# Patient Record
Sex: Female | Born: 1985
Health system: Southern US, Community
[De-identification: ages and names within clinical notes are randomized; demographics above are authoritative.]

## PROBLEM LIST (undated history)

## (undated) DIAGNOSIS — F419 Anxiety disorder, unspecified: Secondary | ICD-10-CM

## (undated) HISTORY — DX: Anxiety disorder, unspecified: F41.9

---

## 2015-09-11 ENCOUNTER — Encounter: Payer: Self-pay | Admitting: Emergency Medicine

## 2015-09-11 ENCOUNTER — Emergency Department
Admission: EM | Admit: 2015-09-11 | Discharge: 2015-09-11 | Disposition: A | Discharge status: Home / Self Care | Payer: BLUE CROSS/BLUE SHIELD | Source: Home / Self Care | Attending: Family Medicine | Admitting: Family Medicine

## 2015-09-11 DIAGNOSIS — H00016 Hordeolum externum left eye, unspecified eyelid: Secondary | ICD-10-CM

## 2015-09-11 MED ORDER — SULFACETAMIDE SODIUM 10 % OP OINT: TOPICAL_OINTMENT | Freq: Four times a day (QID) | OPHTHALMIC | Status: DC

## 2015-09-11 NOTE — Discharge Instructions (Signed)
Begin applying warm compresses to left eye four times daily.  Stye A stye is a bump on your eyelid caused by a bacterial infection. A stye can form inside the eyelid (internal stye) or outside the eyelid (external stye). An internal stye may be caused by an infected oil-producing gland inside your eyelid. An external stye may be caused by an infection at the base of your eyelash (hair follicle). Styes are very common. Anyone can get them at any age. They usually occur in just one eye, but you may have more than one in either eye.  CAUSES  The infection is almost always caused by bacteria called Staphylococcus aureus. This is a common type of bacteria that lives on your skin. RISK FACTORS You may be at higher risk for a stye if you have had one before. You may also be at higher risk if you have:  Diabetes.  Long-term illness.  Long-term eye redness.  A skin condition called seborrhea.  High fat levels in your blood (lipids). SIGNS AND SYMPTOMS  Eyelid pain is the most common symptom of a stye. Internal styes are more painful than external styes. Other signs and symptoms may include:  Painful swelling of your eyelid.  A scratchy feeling in your eye.  Tearing and redness of your eye.  Pus draining from the stye. DIAGNOSIS  Your health care provider may be able to diagnose a stye just by examining your eye. The health care provider may also check to make sure:  You do not have a fever or other signs of a more serious infection.  The infection has not spread to other parts of your eye or areas around your eye. TREATMENT  Most styes will clear up in a few days without treatment. In some cases, you may need to use antibiotic drops or ointment to prevent infection. Your health care provider may have to drain the stye surgically if your stye is:  Large.  Causing a lot of pain.  Interfering with your vision. This can be done using a thin blade or a needle.  HOME CARE INSTRUCTIONS     Take medicines only as directed by your health care provider.  Apply a clean, warm compress to your eye for 10 minutes, 4 times a day.  Do not wear contact lenses or eye makeup until your stye has healed.  Do not try to pop or drain the stye. SEEK MEDICAL CARE IF:  You have chills or a fever.  Your stye does not go away after several days.  Your stye affects your vision.  Your eyeball becomes swollen, red, or painful. MAKE SURE YOU:  Understand these instructions.  Will watch your condition.  Will get help right away if you are not doing well or get worse.   This information is not intended to replace advice given to you by your health care provider. Make sure you discuss any questions you have with your health care provider.   Document Released: 06/01/2005 Document Revised: 09/12/2014 Document Reviewed: 12/06/2013 Elsevier Interactive Patient Education [employer]2016 Elsevier Inc.

## 2015-09-11 NOTE — ED Provider Notes (Signed)
CSN: [id number]     Arrival date & time 09/11/15  [id number] History   First MD Initiated Contact with Patient 09/11/15 [phone]     Chief Complaint  Patient presents with  . Eye Problem      HPI Comments: Patient complains of two day history of swelling and soreness in her left upper eyelid.  No changes in vision.  Patient is a 30 y.o. female presenting with eye problem. The history is provided by the patient.  Eye Problem Location:  L eye Quality:  Aching Severity:  Mild Onset quality:  Gradual Duration:  2 days Timing:  Constant Progression:  Worsening Chronicity:  New Context: contact lenses   Context: not burn, not foreign body and not scratch   Relieved by:  None tried Worsened by:  Contact Ineffective treatments:  Heat Associated symptoms: blurred vision, discharge, itching, redness and swelling   Associated symptoms: no decreased vision, no double vision, no foreign body sensation, no headaches, no photophobia and no tearing   Risk factors: no recent URI     History reviewed. No pertinent past medical history. History reviewed. No pertinent past surgical history. Family History  Problem Relation Age of Onset  . Cancer Mother    Social History  Substance Use Topics  . Smoking status: Former Games developermoker  . Smokeless tobacco: None  . Alcohol Use: Yes   OB History    No data available     Review of Systems  Eyes: Positive for blurred vision, discharge, redness and itching. Negative for double vision and photophobia.  Neurological: Negative for headaches.  All other systems reviewed and are negative.   Allergies  Penicillins  Home Medications   Prior to Admission medications   Medication Sig Start Date End Date Taking? Authorizing Provider  sulfacetamide (BLEPH-10) 10 % ophthalmic ointment Place into the left eye 4 (four) times daily. 09/11/15   Lattie HawStephen A Beese, MD   Meds Ordered and Administered this Visit  Medications - No data to display  BP 121/71 mmHg  Pulse 95   Temp(Src) 98.3 F (36.8 C) (Oral)  Ht 5\' 4"  (1.626 m)  Wt 180 lb (81.647 kg)  BMI 30.88 kg/m2  SpO2 96%  LMP 09/08/2015 (Exact Date) No data found.   Physical Exam  Constitutional: She appears well-developed and well-nourished. No distress.  HENT:  Head: Normocephalic.  Nose: Nose normal.  Mouth/Throat: Oropharynx is clear and moist.  Eyes: Conjunctivae and EOM are normal. Pupils are equal, round, and reactive to light. Lids are everted and swept, no foreign bodies found. Left eye exhibits hordeolum. Left eye exhibits no chemosis, no discharge and no exudate. No foreign body present in the left eye.    Left upper eyelid mildly swollen, erythematous, and tender to palpation.  Not fluctuant.  No evidence pre-septal cellulitis  Neck: Neck supple.  Lymphadenopathy:    She has no cervical adenopathy.  Nursing note and vitals reviewed.   ED Course  Procedures  None   Visual Acuity Review  Right Eye Distance: 20/30 Left Eye Distance: 20/40 Bilateral Distance: 20/20 (with correction)    MDM   1. External hordeolum, left    Begin sulfacetamide ophthalmic ointment QID. Begin applying warm compresses to left eye four times daily. Followup with ophthalmologist if not improved 3 to 4 times daily.    Lattie HawStephen A Beese, MD 09/11/15 [phone]

## 2015-09-11 NOTE — ED Notes (Signed)
Left upper eye lid red, swollen and painful x 2 days

## 2015-09-16 ENCOUNTER — Telehealth: Payer: Self-pay | Admitting: *Deleted

## 2015-09-16 MED ORDER — ERYTHROMYCIN 5 MG/GM OP OINT: TOPICAL_OINTMENT | OPHTHALMIC | Status: DC

## 2015-12-15 ENCOUNTER — Encounter: Payer: Self-pay | Admitting: *Deleted

## 2015-12-15 ENCOUNTER — Emergency Department
Admission: EM | Admit: 2015-12-15 | Discharge: 2015-12-15 | Disposition: A | Discharge status: Home / Self Care | Payer: BLUE CROSS/BLUE SHIELD | Source: Home / Self Care | Attending: Family Medicine | Admitting: Family Medicine

## 2015-12-15 DIAGNOSIS — J029 Acute pharyngitis, unspecified: Secondary | ICD-10-CM | POA: Diagnosis not present

## 2015-12-15 DIAGNOSIS — R05 Cough: Secondary | ICD-10-CM

## 2015-12-15 DIAGNOSIS — R059 Cough, unspecified: Secondary | ICD-10-CM

## 2015-12-15 LAB — POCT RAPID STREP A (OFFICE): Rapid Strep A Screen: NEGATIVE

## 2015-12-15 NOTE — Discharge Instructions (Signed)
You may take 400-600mg Ibuprofen (Motrin) every 6-8 hours for fever and pain  °Alternate with Tylenol  °You may take 500mg Tylenol every 4-6 hours as needed for fever and pain  °Follow-up with your primary care provider next week for recheck of symptoms if not improving.  °Be sure to drink plenty of fluids and rest, at least 8hrs of sleep a night, preferably more while you are sick. °Return urgent care or go to closest ER if you cannot keep down fluids/signs of dehydration, fever not reducing with Tylenol, difficulty breathing/wheezing, stiff neck, worsening condition, or other concerns (see below)  ° ° °Cool Mist Vaporizers °Vaporizers may help relieve the symptoms of a cough and cold. They add moisture to the air, which helps mucus to become thinner and less sticky. This makes it easier to breathe and cough up secretions. Cool mist vaporizers do not cause serious burns like hot mist vaporizers, which may also be called steamers or humidifiers. Vaporizers have not been proven to help with colds. You should not use a vaporizer if you are allergic to mold. °HOME CARE INSTRUCTIONS °· Follow the package instructions for the vaporizer. °· Do not use anything other than distilled water in the vaporizer. °· Do not run the vaporizer all of the time. This can cause mold or bacteria to grow in the vaporizer. °· Clean the vaporizer after each time it is used. °· Clean and dry the vaporizer well before storing it. °· Stop using the vaporizer if worsening respiratory symptoms develop. °  °This information is not intended to replace advice given to you by your health care provider. Make sure you discuss any questions you have with your health care provider. °  °Document Released: 05/19/2004 Document Revised: 08/27/2013 Document Reviewed: 01/09/2013 °Elsevier Interactive Patient Education ©2016 Elsevier Inc. ° °

## 2015-12-15 NOTE — ED Provider Notes (Signed)
CSN: [id number]     Arrival date & time 12/15/15  4098 History   First MD Initiated Contact with Patient 12/15/15 0930     Chief Complaint  Patient presents with  . Sore Throat   (Consider location/radiation/quality/duration/timing/severity/associated sxs/prior Treatment) HPI The pt is a 29yo female presenting to [facility] with c/o sore throat and cough that started about 3 days ago. Cough is mildly intermittent and dry.  Throat pain has been waxing and waning, worse in the morning and with swallowing.  Minimal pain at this time. She notes she was around children this weekend in [place] but no known sick contacts. Denies fever, chills, n/v/d.    History reviewed. No pertinent past medical history. History reviewed. No pertinent past surgical history. Family History  Problem Relation Age of Onset  . Cancer Mother     breast  . Heart disease Father    Social History  Substance Use Topics  . Smoking status: Former Games developer  . Smokeless tobacco: None  . Alcohol Use: Yes   OB History    No data available     Review of Systems  Constitutional: Negative for fever and chills.  HENT: Positive for sore throat. Negative for congestion, ear pain, trouble swallowing and voice change.   Respiratory: Positive for cough. Negative for shortness of breath.   Cardiovascular: Negative for chest pain and palpitations.  Gastrointestinal: Negative for nausea, vomiting, abdominal pain and diarrhea.  Musculoskeletal: Negative for myalgias, back pain and arthralgias.  Skin: Negative for rash.    Allergies  Penicillins  Home Medications   Prior to Admission medications   Not on File   Meds Ordered and Administered this Visit  Medications - No data to display  BP 129/81 mmHg  Pulse 98  Temp(Src) 98.4 F (36.9 C) (Oral)  Resp 16  Ht  (1.626 m)  Wt 178 lb (80.74 kg)  BMI 30.54 kg/m2  SpO2 98%  LMP 12/15/2015 No data found.   Physical Exam  Constitutional: She appears  well-developed and well-nourished. No distress.  HENT:  Head: Normocephalic and atraumatic.  Right Ear: Tympanic membrane normal.  Left Ear: Tympanic membrane normal.  Nose: Nose normal.  Mouth/Throat: Uvula is midline and mucous membranes are normal. Posterior oropharyngeal erythema present. No oropharyngeal exudate, posterior oropharyngeal edema or tonsillar abscesses.  Eyes: Conjunctivae are normal. No scleral icterus.  Neck: Normal range of motion. Neck supple.  Cardiovascular: Normal rate, regular rhythm and normal heart sounds.   Pulmonary/Chest: Effort normal and breath sounds normal. No stridor. No respiratory distress. She has no wheezes. She has no rales.  Abdominal: Soft. She exhibits no distension. There is no tenderness.  Musculoskeletal: Normal range of motion.  Lymphadenopathy:    She has no cervical adenopathy.  Neurological: She is alert.  Skin: Skin is warm and dry. She is not diaphoretic.  Nursing note and vitals reviewed.   ED Course  Procedures (including critical care time)  Labs Review Labs Reviewed  STREP A DNA PROBE  POCT RAPID STREP A (OFFICE)    Imaging Review No results found.   MDM   1. Acute pharyngitis, unspecified etiology   2. Cough    Pt c/o cough and sore throat for 3 days. Pt is afebrile. Moist mucous membranes.   Rapid strep: Negative  No evidence of bacterial infection at this time. Symptoms likely viral. Encouraged symptomatic treatment.  Advised pt to use acetaminophen and ibuprofen as needed for fever and pain. Encouraged rest and fluids including salt  water gargles. F/u with PCP in 7-10 days if not improving, sooner if worsening. Pt verbalized understanding and agreement with tx plan.     Junius FinnerErin O'Malley, PA-C 12/15/15 [phone]

## 2015-12-15 NOTE — ED Notes (Signed)
Pt c/o sore throat and non productive cough x 3 days. Denies fever. Taking IBF, Nyquil and numbing cough gtts.

## 2015-12-16 ENCOUNTER — Telehealth: Payer: Self-pay | Admitting: *Deleted

## 2015-12-16 LAB — STREP A DNA PROBE: GASP: NOT DETECTED

## 2015-12-18 ENCOUNTER — Encounter: Payer: Self-pay | Admitting: Emergency Medicine

## 2015-12-18 ENCOUNTER — Emergency Department (INDEPENDENT_AMBULATORY_CARE_PROVIDER_SITE_OTHER)
Admission: EM | Admit: 2015-12-18 | Discharge: 2015-12-18 | Disposition: A | Discharge status: Home / Self Care | Payer: BLUE CROSS/BLUE SHIELD | Source: Home / Self Care | Attending: Family Medicine | Admitting: Family Medicine

## 2015-12-18 DIAGNOSIS — J069 Acute upper respiratory infection, unspecified: Secondary | ICD-10-CM

## 2015-12-18 DIAGNOSIS — B9789 Other viral agents as the cause of diseases classified elsewhere: Principal | ICD-10-CM

## 2015-12-18 MED ORDER — AZITHROMYCIN 250 MG PO TABS: ORAL_TABLET | ORAL | Status: DC

## 2015-12-18 MED ORDER — BENZONATATE 200 MG PO CAPS: 200.0000 mg | ORAL_CAPSULE | Freq: Every day | ORAL | Status: DC

## 2015-12-18 NOTE — ED Notes (Signed)
Cough x 4 days was seen by Denny PeonErin on Tuesday, but cough is worse. Throat culture was neg.

## 2015-12-18 NOTE — Discharge Instructions (Signed)
Take plain guaifenesin (1200mg  extended release tabs such as Mucinex) twice daily, with plenty of water, for cough and congestion.  May add Pseudoephedrine (30mg , one or two every 4 to 6 hours) for sinus congestion.  Get adequate rest.   May use Afrin nasal spray (or generic oxymetazoline) twice daily for about 5 days and then discontinue.  Also recommend using saline nasal spray several times daily and saline nasal irrigation (AYR is a common brand).    Try warm salt water gargles for sore throat.  Stop all antihistamines for now, and other non-prescription cough/cold preparations. May take Ibuprofen 200mg , 4 tabs every 8 hours with food for sore throat, fever, etc.

## 2015-12-18 NOTE — ED Provider Notes (Signed)
CSN: [id number]     Arrival date & time 12/18/15  0825 History   First MD Initiated Contact with Patient 12/18/15 [phone]     Chief Complaint  Patient presents with  . Cough      HPI Comments: About 6 days ago patient developed typical cold-like symptoms developing over several days,  including mild sore throat, sinus congestion and fatigue.  She was seen and evaluated her 3 days ago for a viral URI.  Since then she has developed increasing cough that is now non-productive and worse at night.  She complains of tightness in her anterior chest and shortness of breath with activity.  She still has mild sore throat.  She states that her right ear feels clogged.   She states that she is beginning to cough until she gags.  She does not remember her last Tdap.  The history is provided by the patient.    History reviewed. No pertinent past medical history. History reviewed. No pertinent past surgical history. Family History  Problem Relation Age of Onset  . Cancer Mother     breast  . Heart disease Father    Social History  Substance Use Topics  . Smoking status: Former Games developer  . Smokeless tobacco: None  . Alcohol Use: Yes   OB History    No data available     Review of Systems + sore throat + cough No pleuritic pain but feels tightness in anterior chest ? wheezing ? nasal congestion + post-nasal drainage No sinus pain/pressure No itchy/red eyes ? right earache No hemoptysis + SOB No fever, + chills No nausea No vomiting No abdominal pain No diarrhea No urinary symptoms No skin rash + fatigue No myalgias No headache Used OTC meds without relief  Allergies  Penicillins  Home Medications   Prior to Admission medications   Medication Sig Start Date End Date Taking? Authorizing Provider  azithromycin (ZITHROMAX Z-PAK) 250 MG tablet Take 2 tabs today; then begin one tab once daily for 4 more days. 12/18/15   Lattie Haw, MD  benzonatate (TESSALON) 200 MG capsule Take 1  capsule (200 mg total) by mouth at bedtime. Take as needed for cough 12/18/15   Lattie Haw, MD   Meds Ordered and Administered this Visit  Medications - No data to display  BP 124/84 mmHg  Pulse 102  Temp(Src) 99.1 F (37.3 C)  Ht  (1.626 m)  Wt 175 lb (79.379 kg)  BMI 30.02 kg/m2  SpO2 98%  LMP 12/15/2015 No data found.   Physical Exam Nursing notes and Vital Signs reviewed. Appearance:  Patient appears stated age, and in no acute distress Eyes:  Pupils are equal, round, and reactive to light and accomodation.  Extraocular movement is intact.  Conjunctivae are not inflamed  Ears:  Canals normal.  Tympanic membranes normal, although right tympanic membrane appears slightly bulging  Nose:  Markedly congested turbinates.  No sinus tenderness.     Pharynx:  Uvula edematous, otherwise normal Neck:  Supple.  Tender enlarged posterior/lateral nodes are palpated bilaterally  Lungs:  Clear to auscultation.  Breath sounds are equal.  Moving air well. Heart:  Regular rate and rhythm without murmurs, rubs, or gallops.  Abdomen:  Nontender without masses or hepatosplenomegaly.  Bowel sounds are present.  No CVA or flank tenderness.  Extremities:  No edema.  Skin:  No rash present.   ED Course  Procedures  None  Tympanogram:  Normal both ears.   MDM  1. Viral URI with cough     Note low grade fever; ?early bacterial sinusitis Begin Z-pack for atypical coverage.  Prescription written for Benzonatate [facility](Tessalon) to take at bedtime for night-time cough.   Take plain guaifenesin (1200mg  extended release tabs such as Mucinex) twice daily, with plenty of water, for cough and congestion.  May add Pseudoephedrine (30mg , one or two every 4 to 6 hours) for sinus congestion.  Get adequate rest.   May use Afrin nasal spray (or generic oxymetazoline) twice daily for about 5 days and then discontinue.  Also recommend using saline nasal spray several times daily and saline nasal irrigation  (AYR is a common brand).    Try warm salt water gargles for sore throat.  Stop all antihistamines for now, and other non-prescription cough/cold preparations. May take Ibuprofen 200mg , 4 tabs every 8 hours with food for sore throat, fever, etc.   Lattie HawStephen A Chonda Baney, MD 12/18/15 [phone]

## 2016-01-05 ENCOUNTER — Encounter: Payer: Self-pay | Admitting: *Deleted

## 2016-01-05 ENCOUNTER — Emergency Department
Admission: EM | Admit: 2016-01-05 | Discharge: 2016-01-05 | Disposition: A | Discharge status: Home / Self Care | Payer: BLUE CROSS/BLUE SHIELD | Source: Home / Self Care | Attending: Family Medicine | Admitting: Family Medicine

## 2016-01-05 DIAGNOSIS — L250 Unspecified contact dermatitis due to cosmetics: Secondary | ICD-10-CM

## 2016-01-05 MED ORDER — CLINDAMYCIN HCL 300 MG PO CAPS: 300.0000 mg | ORAL_CAPSULE | Freq: Three times a day (TID) | ORAL | Status: DC

## 2016-01-05 MED ORDER — METHYLPREDNISOLONE SODIUM SUCC 125 MG IJ SOLR
80.0000 mg | Freq: Once | INTRAMUSCULAR | Status: AC
  Administered 2016-01-05: 80 mg via INTRAMUSCULAR

## 2016-01-05 MED ORDER — PREDNISONE 20 MG PO TABS: 20.0000 mg | ORAL_TABLET | Freq: Two times a day (BID) | ORAL | Status: DC

## 2016-01-05 NOTE — ED Provider Notes (Signed)
CSN: [id number]     Arrival date & time 01/05/16  1308 History   First MD Initiated Contact with Patient 01/05/16 1339     Chief Complaint  Patient presents with  . Allergic Reaction      HPI Comments: Patient used a new eye mascara two days ago.  Yesterday she awoke with swollen pruritic eyelids.  Today she had increased peri-orbital swelling extending to her cheeks.  She has not felt quite well today.  No fevers, chills, and sweats.  No eye pain.  No changes in vision.  Patient is a 30 y.o. female presenting with eye problem. The history is provided by the patient.  Eye Problem Location:  Both Quality: itching. Severity:  Severe Onset quality:  Gradual Duration:  2 days Timing:  Constant Progression:  Worsening Chronicity:  New Context comment:  New eye mascara Relieved by:  Nothing Worsened by:  Nothing tried Ineffective treatments: cool compresses and Benadryl. Associated symptoms: crusting, discharge, facial rash, inflammation, itching, redness, swelling and tearing   Associated symptoms: no blurred vision, no decreased vision, no double vision, no foreign body sensation, no headaches, no nausea, no photophobia and no scotomas     History reviewed. No pertinent past medical history. History reviewed. No pertinent past surgical history. Family History  Problem Relation Age of Onset  . Cancer Mother     breast  . Heart disease Father    Social History  Substance Use Topics  . Smoking status: Former Games developermoker  . Smokeless tobacco: None  . Alcohol Use: Yes   OB History    No data available     Review of Systems  Eyes: Positive for discharge, redness and itching. Negative for blurred vision, double vision and photophobia.  Gastrointestinal: Negative for nausea.  Neurological: Negative for headaches.  All other systems reviewed and are negative.   Allergies  Penicillins  Home Medications   Prior to Admission medications   Medication Sig Start Date End Date Taking?  Authorizing Provider  clindamycin (CLEOCIN) 300 MG capsule Take 1 capsule (300 mg total) by mouth 3 (three) times daily. 01/05/16   Lattie HawStephen A Jasman Murri, MD  predniSONE (DELTASONE) 20 MG tablet Take 1 tablet (20 mg total) by mouth 2 (two) times daily. Take with food. 01/05/16   Lattie HawStephen A Joron Velis, MD   Meds Ordered and Administered this Visit   Medications  methylPREDNISolone sodium succinate (SOLU-MEDROL) 125 mg/2 mL injection 80 mg (not administered)    BP 135/75 mmHg  Pulse 94  Temp(Src) 98.3 F (36.8 C) (Oral)  Resp 16  Ht 5\' 4"  (1.626 m)  Wt 178 lb (80.74 kg)  BMI 30.54 kg/m2  SpO2 100%  LMP 12/15/2015 No data found.   Physical Exam  Constitutional: She appears well-developed and well-nourished. No distress.  HENT:  Head:    Right Ear: External ear normal.  Left Ear: External ear normal.  Nose: Nose normal.  Mouth/Throat: Oropharynx is clear and moist.  Upper and lower eyelids are edematous and mildly erythematous bilaterally, extending to both cheeks  as noted on diagram.  Upper eyelids are mildly tender to palpation     Eyes: Conjunctivae and EOM are normal. Pupils are equal, round, and reactive to light.    Neck: Neck supple.  Lymphadenopathy:    She has no cervical adenopathy.  Nursing note and vitals reviewed.   ED Course  Procedures none   MDM   1. Contact dermatitis due to cosmetics    Solumedrol 80mg  IM; begin prednisone  burst tomorrow. Concern also for preseptal cellulitis; will begin clindamycin  TID. Begin prednisone on Wednesday 01/06/16. Continue to apply cool compress several times daily.  May take Benadryl or a non-sedating antihistamine. Avoid using contact lens until condition resolved. If symptoms become significantly worse during the night or over the weekend, proceed to the local emergency room.     Lattie Haw, MD 01/05/16 [phone]

## 2016-01-05 NOTE — Discharge Instructions (Signed)
Begin prednisone on Wednesday 01/06/16. Continue to apply cool compress several times daily.  May take Benadryl or a non-sedating antihistamine. Avoid using contact lens until condition resolved. If symptoms become significantly worse during the night or over the weekend, proceed to the local emergency room.    Contact Dermatitis Dermatitis is redness, soreness, and swelling (inflammation) of the skin. Contact dermatitis is a reaction to certain substances that touch the skin. There are two types of contact dermatitis:   Irritant contact dermatitis. This type is caused by something that irritates your skin, such as dry hands from washing them too much. This type does not require previous exposure to the substance for a reaction to occur. This type is more common.  Allergic contact dermatitis. This type is caused by a substance that you are allergic to, such as a nickel allergy or poison ivy. This type only occurs if you have been exposed to the substance (allergen) before. Upon a repeat exposure, your body reacts to the substance. This type is less common. CAUSES  Many different substances can cause contact dermatitis. Irritant contact dermatitis is most commonly caused by exposure to:   Makeup.   Soaps.   Detergents.   Bleaches.   Acids.   Metal salts, such as nickel.  Allergic contact dermatitis is most commonly caused by exposure to:   Poisonous plants.   Chemicals.   Jewelry.   Latex.   Medicines.   Preservatives in products, such as clothing.  RISK FACTORS This condition is more likely to develop in:   People who have jobs that expose them to irritants or allergens.  People who have certain medical conditions, such as asthma or eczema.  SYMPTOMS  Symptoms of this condition may occur anywhere on your body where the irritant has touched you or is touched by you. Symptoms include:  Dryness or flaking.   Redness.   Cracks.   Itching.   Pain or a  burning feeling.   Blisters.  Drainage of small amounts of blood or clear fluid from skin cracks. With allergic contact dermatitis, there may also be swelling in areas such as the eyelids, mouth, or genitals.  DIAGNOSIS  This condition is diagnosed with a medical history and physical exam. A patch skin test may be performed to help determine the cause. If the condition is related to your job, you may need to see an occupational medicine specialist. TREATMENT Treatment for this condition includes figuring out what caused the reaction and protecting your skin from further contact. Treatment may also include:   Steroid creams or ointments. Oral steroid medicines may be needed in more severe cases.  Antibiotics or antibacterial ointments, if a skin infection is present.  Antihistamine lotion or an antihistamine taken by mouth to ease itching.  A bandage (dressing). HOME CARE INSTRUCTIONS Skin Care  Moisturize your skin as needed.   Apply cool compresses to the affected areas.  Do not scratch your skin.  Bathe less frequently, such as every other day.  Bathe in lukewarm water. Avoid using hot water. Medicines  Take or apply over-the-counter and prescription medicines only as told by your health care provider.   If you were prescribed an antibiotic medicine, take or apply your antibiotic as told by your health care provider. Do not stop using the antibiotic even if your condition starts to improve. General Instructions  Keep all follow-up visits as told by your health care provider. This is important.  Avoid the substance that caused your reaction. If  you do not know what caused it, keep a journal to try to track what caused it. Write down:  What you eat.  What cosmetic products you use.  What you drink.  What you wear in the affected area. This includes jewelry.  If you were given a dressing, take care of it as told by your health care provider. This includes when to  change and remove it. SEEK MEDICAL CARE IF:   Your condition does not improve with treatment.  Your condition gets worse.  You have signs of infection such as swelling, tenderness, redness, soreness, or warmth in the affected area.  You have a fever.  You have new symptoms. SEEK IMMEDIATE MEDICAL CARE IF:   You have a severe headache, neck pain, or neck stiffness.  You vomit.  You feel very sleepy.  You notice red streaks coming from the affected area.  Your bone or joint underneath the affected area becomes painful after the skin has healed.  The affected area turns darker.  You have difficulty breathing.   This information is not intended to replace advice given to you by your health care provider. Make sure you discuss any questions you have with your health care provider.   Document Released: 08/19/2000 Document Revised: 05/13/2015 Document Reviewed: 01/07/2015 Elsevier Interactive Patient Education [employer]

## 2016-01-05 NOTE — ED Notes (Signed)
Pt c/o bilateral eye swelling x 2 days after using a new mascara. She has applied ice and took Benadryl 25mg  at 12:30pm today.

## 2016-11-23 ENCOUNTER — Emergency Department
Admission: EM | Admit: 2016-11-23 | Discharge: 2016-11-23 | Disposition: A | Discharge status: Home / Self Care | Payer: BLUE CROSS/BLUE SHIELD | Source: Home / Self Care | Attending: Family Medicine | Admitting: Family Medicine

## 2016-11-23 ENCOUNTER — Emergency Department (INDEPENDENT_AMBULATORY_CARE_PROVIDER_SITE_OTHER): Payer: BLUE CROSS/BLUE SHIELD

## 2016-11-23 DIAGNOSIS — M19071 Primary osteoarthritis, right ankle and foot: Secondary | ICD-10-CM | POA: Diagnosis not present

## 2016-11-23 DIAGNOSIS — M7731 Calcaneal spur, right foot: Secondary | ICD-10-CM

## 2016-11-23 DIAGNOSIS — M79672 Pain in left foot: Secondary | ICD-10-CM

## 2016-11-23 DIAGNOSIS — M722 Plantar fascial fibromatosis: Secondary | ICD-10-CM

## 2016-11-23 DIAGNOSIS — M79671 Pain in right foot: Secondary | ICD-10-CM

## 2016-11-23 MED ORDER — NAPROXEN 500 MG PO TABS: 500.0000 mg | ORAL_TABLET | Freq: Two times a day (BID) | ORAL | 0 refills | Status: DC

## 2016-11-23 NOTE — ED Provider Notes (Signed)
CSN: [id number]     Arrival date & time 11/23/16  0813 History   First MD Initiated Contact with Patient 11/23/16 [phone]     Chief Complaint  Patient presents with  . Foot Pain   (Consider location/radiation/quality/duration/timing/severity/associated sxs/prior Treatment) HPI Betty Stanton is a 31 y.o. female presenting to UC with c/o 1 month of gradually worsening Right foot pain that is worse in her heel.  Over the last 1 week she started to develop pain in her Left foot toward the ball of her foot.  Yesterday, after work, pain started to radiate into her legs.  Pain is 2/10 at this time but moderately worse after going to work all day. Pt notes she is walking on hard floors during the day.  She recently got new shoes with better cushioning and has been soaking her feet in Epson salt but no relief.  She has not tried any acetaminophen or ibuprofen.    History reviewed. No pertinent past medical history. History reviewed. No pertinent surgical history. Family History  Problem Relation Age of Onset  . Cancer Mother     breast  . Heart disease Father    Social History  Substance Use Topics  . Smoking status: Former Games developermoker  . Smokeless tobacco: Not on file  . Alcohol use Yes   OB History    No data available     Review of Systems  Musculoskeletal: Positive for arthralgias, gait problem and myalgias.  Skin: Negative for color change and wound.  Neurological: Negative for weakness and numbness.    Allergies  Penicillins  Home Medications   Prior to Admission medications   Medication Sig Start Date End Date Taking? Authorizing Provider  clindamycin (CLEOCIN) 300 MG capsule Take 1 capsule (300 mg total) by mouth 3 (three) times daily. 01/05/16   Lattie HawStephen A Beese, MD  naproxen (NAPROSYN) 500 MG tablet Take 1 tablet (500 mg total) by mouth 2 (two) times daily. 11/23/16   Junius FinnerErin O'Malley, PA-C  predniSONE (DELTASONE) 20 MG tablet Take 1 tablet (20 mg total) by mouth 2 (two) times daily.  Take with food. 01/05/16   Lattie HawStephen A Beese, MD   Meds Ordered and Administered this Visit  Medications - No data to display  BP 130/84 (BP Location: Left Arm)   Pulse 73   Temp 97.9 F (36.6 C) (Oral)   Ht 5\' 4"  (1.626 m)   Wt 173 lb (78.5 kg)   LMP 11/04/2016   SpO2 99%   BMI 29.70 kg/m  No data found.   Physical Exam  Constitutional: She is oriented to person, place, and time. She appears well-developed and well-nourished. No distress.  HENT:  Head: Normocephalic and atraumatic.  Eyes: EOM are normal.  Neck: Normal range of motion.  Cardiovascular: Normal rate.   Pulses:      Dorsalis pedis pulses are 2+ on the right side, and 2+ on the left side.  Pulmonary/Chest: Effort normal.  Musculoskeletal: Normal range of motion. She exhibits tenderness. She exhibits no edema.  Bilateral feet: no edema or deformity. Mild tenderness to bottom of Right heel. Mild tenderness to distal plantar aspect of metatarsals in Left foot.  Full ROM ankles and toes.   Neurological: She is alert and oriented to person, place, and time.  Skin: Skin is warm and dry. Capillary refill takes less than 2 seconds. She is not diaphoretic.  Psychiatric: She has a normal mood and affect. Her behavior is normal.  Nursing note and vitals reviewed.  Urgent Care Course     Procedures (including critical care time)  Labs Review Labs Reviewed - No data to display  Imaging Review Dg Foot Complete Right  Result Date: 11/23/2016 CLINICAL DATA:  Right heel pain for 1 month, no known injury, initial encounter EXAM: RIGHT FOOT COMPLETE - 3+ VIEW COMPARISON:  None. FINDINGS: No acute fracture or dislocation is noted. Small plantar spur is noted arising from the calcaneus. No soft tissue abnormality is noted. IMPRESSION: Mild degenerative change without acute abnormality. Electronically Signed   By: Alcide Clever M.D.   On: 11/23/2016 08:55    MDM   1. Bilateral foot pain   2. Pain of right heel   3. Heel  spur, right   4. Plantar fasciitis of right foot    Hx and exam c/w plantar fascitis   Rx: Naproxen Encouraged to wear heal cups in her shoes to help with the pain. Home care instructions with exercises provided. f/u with Sports Medicine in 1-2 weeks if not improving.     Junius Finner, PA-C 11/23/16 [phone]

## 2016-11-23 NOTE — ED Triage Notes (Signed)
Pt stated that she has had pain in the feet for about a month, has become much worse the last week or so.  It started mostly on the right foot, with pulling across the arch into the heel.  Now it is in both feet.  Yesterday pain started radiating into her legs.

## 2017-01-27 ENCOUNTER — Ambulatory Visit: Payer: BLUE CROSS/BLUE SHIELD | Admitting: Podiatry

## 2017-02-09 ENCOUNTER — Ambulatory Visit (INDEPENDENT_AMBULATORY_CARE_PROVIDER_SITE_OTHER): Payer: BLUE CROSS/BLUE SHIELD | Admitting: Podiatry

## 2017-02-09 ENCOUNTER — Encounter: Payer: Self-pay | Admitting: Podiatry

## 2017-02-09 ENCOUNTER — Ambulatory Visit: Payer: BLUE CROSS/BLUE SHIELD

## 2017-02-09 DIAGNOSIS — M722 Plantar fascial fibromatosis: Secondary | ICD-10-CM

## 2017-02-09 MED ORDER — MELOXICAM 15 MG PO TABS: 15.0000 mg | ORAL_TABLET | Freq: Every day | ORAL | 2 refills | Status: AC

## 2017-02-09 NOTE — Patient Instructions (Signed)

## 2017-02-09 NOTE — Progress Notes (Signed)
   Subjective:    Patient ID: Betty CoolerSara Stanton, female    DOB: 01-Sep-1986, 31 y.o.   MRN: [id number]  HPI  31 year old female presents the office today for concerns of right heel pain which is been ongoing for about 1 month. She states that she went to urgent care and was told she has a heel spur. She gets pain after periods of rest which is improved with activity but she also does work at [employer] and stands on concrete floor she gets pain. She denies any recent injury or trauma. No swelling or redness. No numbness or tingling. The pain does not wake her up at night. She's had no recent treatment. She did go to urgent care the prescribed medication which she did not get this filled as she felt that she did not need this medication. She has no other concerns.   Review of Systems  All other systems reviewed and are negative.      Objective:   Physical Exam General: AAO x3, NAD  Dermatological: Skin is warm, dry and supple bilateral. Nails x 10 are well manicured; remaining integument appears unremarkable at this time. There are no open sores, no preulcerative lesions, no rash or signs of infection present.  Vascular: Dorsalis Pedis artery and Posterior Tibial artery pedal pulses are 2/4 bilateral with immedate capillary fill time. Pedal hair growth present. No varicosities and no lower extremity edema present bilateral. There is no pain with calf compression, swelling, warmth, erythema.   Neruologic: Grossly intact via light touch bilateral. Vibratory intact via tuning fork bilateral. Protective threshold with Semmes Wienstein monofilament intact to all pedal sites bilateral. Negative tinel sign.   Musculoskeletal: Tenderness to palpation along the plantar medial tubercle of the calcaneus at the insertion of plantar fascia on the right foot. There is no pain along the course of the plantar fascia within the arch of the foot. Plantar fascia appears to be intact. There is no pain with  lateral compression of the calcaneus or pain with vibratory sensation. There is no pain along the course or insertion of the achilles tendon. No other areas of tenderness to bilateral lower extremities.  Muscular strength 5/5 in all groups tested bilateral.  Gait: Unassisted, Nonantalgic.      Assessment & Plan:  31 year old female right heel pain likely plantar fasciitis with small inferior calcaneal spurring present. -Treatment options discussed including all alternatives, risks, and complications -Etiology of symptoms were discussed -Previous x-rays are reviewed with the patient. -Declined steroid injection. -Prescribed mobic. Discussed side effects of the medication and directed to stop if any are to occur and call the office.  -Plantar fascial brace -Discussed shoe gear modifications and orthotics -Stretching, icing daily. -RTC 3-4 weeks or sooner if needed. Call any questions or concerns.  Ovid CurdMatthew Wagoner, DPM

## 2017-02-12 DIAGNOSIS — M722 Plantar fascial fibromatosis: Secondary | ICD-10-CM | POA: Insufficient documentation

## 2017-03-02 ENCOUNTER — Encounter: Payer: Self-pay | Admitting: Podiatry

## 2017-03-02 ENCOUNTER — Ambulatory Visit (INDEPENDENT_AMBULATORY_CARE_PROVIDER_SITE_OTHER): Payer: BLUE CROSS/BLUE SHIELD | Admitting: Podiatry

## 2017-03-02 DIAGNOSIS — M722 Plantar fascial fibromatosis: Secondary | ICD-10-CM | POA: Diagnosis not present

## 2017-03-02 NOTE — Progress Notes (Signed)
Subjective: 31 year old female presents the office today for follow up evaluation of right heel pain component fasciitis. She states that her pain has much improved her pain skills 3/10. She has been stretching icing intermittently. She was wearing heel cups in her shoes. She has not changed her shoes. She has no other concerns today. No numbness or tingling. Denies any systemic complaints such as fevers, chills, nausea, vomiting. No acute changes since last appointment, and no other complaints at this time.   Objective: AAO x3, NAD DP/PT pulses palpable bilaterally, CRT less than 3 seconds There is minimal tenderness to palpation along the plantar medial tubercle of the calcaneus at the insertion of plantar fascia on the right foot. There is no pain along the course of the plantar fascia within the arch of the foot. Plantar fascia appears to be intact. There is no pain with lateral compression of the calcaneus or pain with vibratory sensation. There is no pain along the course or insertion of the achilles tendon. No other areas of tenderness to bilateral lower extremities. Equinus is present.  No open lesions or pre-ulcerative lesions.  No pain with calf compression, swelling, warmth, erythema  Assessment: Right heel pain, plantar fasciitis which has improved.   Plan: -All treatment options discussed with the patient including all alternatives, risks, complications.  -Continue stretching, icing exercises daily. -Night splint was dispensed. -Again declined steroid injection. -Mobic as needed. -Discussed shoe gear modifications and orthotics. -RTC 4-6 weeks or sooner if needed. -Patient encouraged to call the office with any questions, concerns, change in symptoms.   Ovid CurdMatthew Sheralyn Pinegar, DPM

## 2017-04-06 ENCOUNTER — Encounter: Payer: Self-pay | Admitting: Osteopathic Medicine

## 2017-04-06 ENCOUNTER — Ambulatory Visit (INDEPENDENT_AMBULATORY_CARE_PROVIDER_SITE_OTHER): Payer: BLUE CROSS/BLUE SHIELD | Admitting: Osteopathic Medicine

## 2017-04-06 VITALS — BP 125/83 | HR 80 | Ht 64.0 in | Wt 180.0 lb

## 2017-04-06 DIAGNOSIS — Z0000 Encounter for general adult medical examination without abnormal findings: Secondary | ICD-10-CM | POA: Diagnosis not present

## 2017-04-06 NOTE — Progress Notes (Signed)
HPI: Betty Stanton is a 31 y.o. female  who [facility] today, 04/06/17,  for chief complaint of:  Chief Complaint  Patient presents with  . Establish Care   Patient here for annual physical / wellness exam.  See preventive care reviewed as below.  Recent labs reviewed in detail with the patient.   Additional concerns today include:  Mom diagnosed [age] with breast cancer, This is about 10 years ago, underwent double mastectomy patient states more as a precautionary measure, unknown pathology.    Past medical, surgical, social and family history reviewed: Patient Active Problem List   Diagnosis Date Noted  . Plantar fasciitis 02/12/2017   No past surgical history on file.   Social History  Substance Use Topics  . Smoking status: Former Games developermoker  . Smokeless tobacco: Never Used  . Alcohol use Yes   Family History  Problem Relation Age of Onset  . Cancer Mother        breast  . Heart disease Father      Current medication list and allergy/intolerance information reviewed:   Current Outpatient Prescriptions  Medication Sig Dispense Refill  . meloxicam (MOBIC) 15 MG tablet Take 1 tablet (15 mg total) by mouth daily. 30 tablet 2   No current facility-administered medications for this visit.    Allergies  Allergen Reactions  . Penicillins       Review of Systems:  Constitutional:  No  fever, no chills, No recent illness, No unintentional weight changes. No significant fatigue.   HEENT: No  headache, no vision change, no hearing change, No sore throat, No  sinus pressure  Cardiac: No  chest pain, No  pressure, No palpitations, No  Orthopnea  Respiratory:  No  shortness of breath. No  Cough  Gastrointestinal: No  abdominal pain, No  nausea, No  vomiting,  No  blood in stool, No  diarrhea, No  constipation   Musculoskeletal: No new myalgia/arthralgia  Genitourinary: No  incontinence, No  abnormal genital bleeding, No  abnormal genital discharge  Skin: No  Rash, No other wounds/concerning lesions  Hem/Onc: No  easy bruising/bleeding, No  abnormal lymph node  Endocrine: No cold intolerance,  No heat intolerance. No polyuria/polydipsia/polyphagia   Neurologic: No  weakness, No  dizziness, No  slurred speech/focal weakness/facial droop  Psychiatric: +concerns with depression, +concerns with anxiety, No sleep problems, No mood problems  Exam:  BP 125/83   Pulse 80   Ht 5\' 4"  (1.626 m)   Wt 180 lb (81.6 kg)   LMP 03/05/2017   BMI 30.90 kg/m   Constitutional: VS see above. General Appearance: alert, well-developed, well-nourished, NAD  Eyes: Normal lids and conjunctive, non-icteric sclera  Ears, Nose, Mouth, Throat: MMM, Normal external inspection ears/nares/mouth/lips/gums. TM normal bilaterally. Pharynx/tonsils no erythema, no exudate. Nasal mucosa normal.   Neck: No masses, trachea midline. No thyroid enlargement. No tenderness/mass appreciated. No lymphadenopathy  Respiratory: Normal respiratory effort. no wheeze, no rhonchi, no rales  Cardiovascular: S1/S2 normal, no murmur, no rub/gallop auscultated. RRR. No lower extremity edema.   Gastrointestinal: Nontender, no masses. No hepatomegaly, no splenomegaly. No hernia appreciated. Bowel sounds normal. Rectal exam deferred.   Musculoskeletal: Gait normal. No clubbing/cyanosis of digits.   Neurological: Normal balance/coordination. No tremor  Skin: warm, dry, intact. No rash/ulcer.     Psychiatric: Normal judgment/insight. Normal mood and affect. Oriented x3.     ASSESSMENT/PLAN:   Annual physical exam - Plan: CBC, Lipid panel, TSH, COMPLETE METABOLIC  PANEL WITH GFR, VITAMIN D 25 Hydroxy (Vit-D Deficiency, Fractures)   FEMALE PREVENTIVE CARE Updated 04/06/17   ANNUAL SCREENING/COUNSELING  Diet/Exercise - HEALTHY HABITS DISCUSSED TO DECREASE CV RISK History  Smoking Status  . Former Smoker  Smokeless Tobacco  . Never Used    History  Alcohol Use  . Yes   Depression screen PHQ 2/9 04/06/2017  Decreased Interest 1  Down, Depressed, Hopeless 1  PHQ - 2 Score 2    Domestic violence concerns - no  HTN SCREENING - SEE VITALS  SEXUAL HEALTH  Sexually active in the past year - No  Need/want STI testing today? - no  Concerns about libido or pain with sex? - n/a  Plans for pregnancy? - n/a  INFECTIOUS DISEASE SCREENING  HIV - needs - declined  GC/CT - does not need  HepC - DOB [birth year] - does not need  TB - does not need  DISEASE SCREENING  Lipid - needs - (+)FH father CAD early age   DM2 - needs  Osteoporosis - does not need  CANCER SCREENING  Cervical - does not need - discussed q3y <30yo guidelines, pt states normal Pap 2 yrs ago, will get Pap next year or sooner if desired   Breast - will review screening guidelines, uncertain rik factor <35yo and mom's diagnosis unknown   Lung - does not need  Colon - does not need  ADULT VACCINATION  Influenza - annual vaccine recommended  Td - booster every 10 years   Zoster - option at 5050, yes at 60+   PCV13 - was not indicated  PPSV23 - was not indicated  There is no immunization history on file for this patient.  OTHER  Fall - exercise and Vit D age 51+ - does not need  Consider ASA - age 30-59 - does not need   Visit summary with medication list and pertinent instructions was printed for patient to review. All questions at time of visit were answered - patient instructed to contact office with any additional concerns. ER/RTC precautions were reviewed with the patient. Follow-up plan: Return for discuss anxeity and review lab results .

## 2017-04-10 ENCOUNTER — Ambulatory Visit: Payer: BLUE CROSS/BLUE SHIELD | Admitting: Podiatry

## 2017-04-13 ENCOUNTER — Telehealth: Payer: Self-pay | Admitting: Osteopathic Medicine

## 2017-04-13 NOTE — Telephone Encounter (Signed)
-----  Message from Emeterio Reeve, DO sent at 04/06/2017  7:50 AM EDT ----- Regarding: brca? Breast cancer screening ?

## 2017-04-13 NOTE — Telephone Encounter (Signed)
Please call patient:   Without knowing her mother's exact breast cancer past all of the, we have the option to do BRCA testing for her (the patient). This is a specimen that we can get in the office, insurance may or may not cover it, however.   She can schedule an office visit to go over the consent for the test with me and obtain the specimen. Can schedule this once I am back in the office.   Alternatively, we can set up an appointment with a genetic counselor to discuss risks versus benefits of early screening for her given family history. This is a referral that would likely be covered by insurance. 

## 2017-04-14 NOTE — Telephone Encounter (Signed)
Left message on patient vm to call office back regarding this. Rhonda Cunningham,CMA  

## 2017-04-18 NOTE — Telephone Encounter (Signed)
Left message on patient to call office back regarding this also sent a letter to the patient regarding this. Rhonda Cunningham,CMA

## 2019-06-26 ENCOUNTER — Ambulatory Visit (INDEPENDENT_AMBULATORY_CARE_PROVIDER_SITE_OTHER): Payer: Self-pay | Admitting: Physician Assistant

## 2019-06-26 ENCOUNTER — Other Ambulatory Visit: Payer: Self-pay | Admitting: *Deleted

## 2019-06-26 ENCOUNTER — Other Ambulatory Visit: Payer: Self-pay

## 2019-06-26 VITALS — BP 145/77 | HR 125 | Ht 64.75 in | Wt 180.0 lb

## 2019-06-26 DIAGNOSIS — N644 Mastodynia: Secondary | ICD-10-CM

## 2019-06-26 DIAGNOSIS — Z803 Family history of malignant neoplasm of breast: Secondary | ICD-10-CM

## 2019-06-26 DIAGNOSIS — R030 Elevated blood-pressure reading, without diagnosis of hypertension: Secondary | ICD-10-CM

## 2019-06-26 DIAGNOSIS — F418 Other specified anxiety disorders: Secondary | ICD-10-CM

## 2019-06-26 DIAGNOSIS — R000 Tachycardia, unspecified: Secondary | ICD-10-CM

## 2019-06-26 DIAGNOSIS — N63 Unspecified lump in unspecified breast: Secondary | ICD-10-CM

## 2019-06-26 NOTE — Patient Instructions (Signed)
Fibrocystic Breast Changes  Fibrocystic breast changes are changes in breast tissue that can cause breasts to become swollen, lumpy, or painful. This can happen due to buildup of scar-like tissue (fibrous tissue) or the forming of fluid-filled lumps (cysts) in the breast. This is a common condition, and it is not cancerous (is benign). The exact cause is not known, but it seems to occur when women go through hormonal changes during their menstrual cycle. Fibrocystic breast changes can affect one or both breasts. What are the causes? The exact cause of fibrocystic breast changes is not known. However, this condition:  May be related to the female hormones estrogen and progesterone.  May be influenced by family traits that get passed from parent to child (genetics). What are the signs or symptoms? Symptoms of this condition may affect one or both breasts, and may include:  Tenderness, mild discomfort, or pain.  Swelling.  Rope-like tissue that can be felt when touching the breast.  Lumps in one or both breasts.  Changes in breast size. Breasts may get larger before the menstrual period and smaller after the menstrual period.  Green or dark brown discharge from the nipple. Symptoms are usually worse before menstrual periods start, and they get better toward the end of menstrual periods. How is this diagnosed? This condition is diagnosed based on your medical history and a physical exam of your breasts. You may also have tests, such as:  A breast X-ray (mammogram).  Ultrasound of your breasts.  MRI.  Removal of a breast tissue sample for testing (breast biopsy). This may be done if your health care provider thinks that something else may be causing changes in your breasts. How is this treated? Often, treatment is not needed for this condition. In some cases, treatment may include:  Taking over-the-counter pain relievers to help lessen pain or discomfort.  Limiting or avoiding  caffeine. Foods and beverages that contain caffeine include chocolate, soda, coffee, and tea.  Reducing sugar and fat in your diet. Your health care provider may also recommend:  A procedure to remove fluid from a cyst that is causing pain (fine needle aspiration).  Surgery to remove a cyst that is large or tender or does not go away. Follow these instructions at home:  Examine your breasts after every menstrual period. If you do not have menstrual periods, check your breasts on the first day of every month. Feel for changes in your breasts, such as: ? More tenderness. ? A new growth. ? A change in size. ? A change in an existing lump.  Take over-the-counter and prescription medicines only as told by your health care provider.  Wear a well-fitted support or sports bra, especially when exercising.  Decrease or avoid caffeine, fat, and sugar in your diet as directed by your health care provider. Contact a health care provider if:  You have fluid leaking from your nipple, especially if it is bloody.  You have new lumps or bumps in your breast.  Your breast becomes enlarged, red, and painful.  You have areas of your breast that pucker inward.  Your nipple appears flat or indented. Get help right away if:  You have redness of your breast and the redness is spreading. Summary  Fibrocystic breast changes are changes in breast tissue that can cause breasts to become swollen, lumpy, or painful.  This condition may be related to the female hormones estrogen and progesterone.  With this condition, it is important to examine your breasts after every   menstrual period. If you do not have menstrual periods, check your breasts on the first day of every month. This information is not intended to replace advice given to you by your health care provider. Make sure you discuss any questions you have with your health care provider. Document Released: 06/08/2006 Document Revised: 08/04/2017  Document Reviewed: 04/20/2016 Elsevier Patient Education  2020 Elsevier Inc.  

## 2019-06-26 NOTE — Progress Notes (Signed)
   Subjective:    Patient ID: Betty Stanton, female    DOB: 29-Oct-1985, 33 y.o.   MRN: [id number]  HPI  Pt is a 33 yo female who presents to the clinic with left breast concerns. For the last 4 months she has noticed some darkening of her lower part of left areola. She feels like area keeps getting bigger. No rash or itching. At times feels swollen and painful. She has not felt any breast mass but she feels "a lot of lumps". No nipple discharge or changes. At times she has some tingling in her left breast. She does lift a lot at work. Her mother had BC at 4 but does not remember her having chemo/radiation. No genetic testing has been done. Pt denies any fever, chills, body aches, weight change, night sweats.    .. Active Ambulatory Problems    Diagnosis Date Noted  . Plantar fasciitis 02/12/2017  . Anxiety about health 06/27/2019  . Breast pain, left 06/27/2019  . Breast mass in female 06/27/2019  . Elevated blood pressure reading 06/27/2019  . Tachycardia 06/27/2019  . Family history of breast cancer in mother 06/27/2019   Resolved Ambulatory Problems    Diagnosis Date Noted  . No Resolved Ambulatory Problems   Past Medical History:  Diagnosis Date  . Anxiety       Review of Systems See HPI.     Objective:   Physical Exam Vitals signs reviewed.  Constitutional:      Appearance: Normal appearance.  Cardiovascular:     Rate and Rhythm: Tachycardia present.     Pulses: Normal pulses.  Pulmonary:     Effort: Pulmonary effort is normal.  Chest:    Neurological:     Mental Status: She is alert.           Assessment & Plan:  Marland KitchenMarland KitchenAnishka was seen today for breast problem.  Diagnoses and all orders for this visit:  Breast pain, left -     MM DIAG BREAST TOMO BILATERAL -     US BREAST LTD UNI LEFT INC AXILLA  Breast mass in female -     MM DIAG BREAST TOMO BILATERAL -     US BREAST LTD UNI LEFT INC AXILLA  Anxiety about health  Elevated blood pressure reading  Tachycardia  Family history of breast cancer in mother   Unclear etiology of breast findings today. Will get imaging. Likely fibrocystic breast.   I suspect BP and HR elevation are due to her anxiety. Certainly follow up for recheck once mammogram and u/s are done.

## 2019-06-27 ENCOUNTER — Encounter: Payer: Self-pay | Admitting: Physician Assistant

## 2019-06-27 DIAGNOSIS — R4589 Other symptoms and signs involving emotional state: Secondary | ICD-10-CM | POA: Insufficient documentation

## 2019-06-27 DIAGNOSIS — F418 Other specified anxiety disorders: Secondary | ICD-10-CM | POA: Insufficient documentation

## 2019-06-27 DIAGNOSIS — Z803 Family history of malignant neoplasm of breast: Secondary | ICD-10-CM | POA: Insufficient documentation

## 2019-06-27 DIAGNOSIS — N644 Mastodynia: Secondary | ICD-10-CM | POA: Insufficient documentation

## 2019-06-27 DIAGNOSIS — R000 Tachycardia, unspecified: Secondary | ICD-10-CM | POA: Insufficient documentation

## 2019-06-27 DIAGNOSIS — N63 Unspecified lump in unspecified breast: Secondary | ICD-10-CM | POA: Insufficient documentation

## 2019-06-27 DIAGNOSIS — R030 Elevated blood-pressure reading, without diagnosis of hypertension: Secondary | ICD-10-CM | POA: Insufficient documentation

## 2019-07-05 ENCOUNTER — Other Ambulatory Visit: Payer: Self-pay | Admitting: Physician Assistant

## 2019-07-05 ENCOUNTER — Other Ambulatory Visit: Payer: Self-pay

## 2019-07-08 ENCOUNTER — Ambulatory Visit
Admission: RE | Admit: 2019-07-08 | Discharge: 2019-07-08 | Disposition: A | Discharge status: Home / Self Care | Payer: No Typology Code available for payment source | Source: Ambulatory Visit | Attending: Physician Assistant | Admitting: Physician Assistant

## 2019-07-08 ENCOUNTER — Other Ambulatory Visit: Payer: Self-pay

## 2019-07-08 NOTE — Progress Notes (Signed)
FYI alexander.   Call patient: normal mammogram. Screening should started at age 33.

## 2019-07-09 ENCOUNTER — Encounter: Payer: Self-pay | Admitting: Neurology

## 2019-07-18 ENCOUNTER — Other Ambulatory Visit: Payer: Self-pay

## 2019-07-18 ENCOUNTER — Ambulatory Visit (HOSPITAL_COMMUNITY): Payer: Self-pay

## 2019-10-25 ENCOUNTER — Emergency Department
Admission: EM | Admit: 2019-10-25 | Discharge: 2019-10-25 | Disposition: A | Discharge status: Home / Self Care | Payer: Self-pay | Source: Home / Self Care | Attending: Family Medicine | Admitting: Family Medicine

## 2019-10-25 ENCOUNTER — Encounter: Payer: Self-pay | Admitting: Emergency Medicine

## 2019-10-25 ENCOUNTER — Other Ambulatory Visit: Payer: Self-pay

## 2019-10-25 ENCOUNTER — Telehealth: Payer: Self-pay | Admitting: Neurology

## 2019-10-25 DIAGNOSIS — R509 Fever, unspecified: Secondary | ICD-10-CM

## 2019-10-25 DIAGNOSIS — U071 COVID-19: Secondary | ICD-10-CM

## 2019-10-25 LAB — POC SARS CORONAVIRUS 2 AG -  ED: SARS Coronavirus 2 Ag: POSITIVE — AB

## 2019-10-25 NOTE — Discharge Instructions (Addendum)
Take plain guaifenesin (1200mg  extended release tabs such as Mucinex) twice daily, with plenty of water, for cough and congestion.  May add Pseudoephedrine (30mg , one or two every 4 to 6 hours) for sinus congestion.  Get adequate rest.   Try warm salt water gargles for sore throat.  Stop all antihistamines for now, and other non-prescription cough/cold preparations. May take Ibuprofen 200mg , 4 tabs every 8 hours with food for body aches, fever, headache, etc. May take Delsym Cough Suppressant at bedtime for nighttime cough.   Isolate yourself until COVID-19 test result is available.  If your COVID19 test is positive, then you are infected with the novel coronavirus and could give the virus to others.  Please continue isolation at home for at least 10 days since the start of your symptoms.  Once you complete your 10 day quarantine, you may return to normal activities as long as you've not had a fever for over 24 hours (without taking fever reducing medicine) and your symptoms are improving. Please continue good preventive care measures, including:  frequent hand-washing, avoid touching your face, cover coughs/sneezes, stay out of crowds and keep a 6 foot distance from others.  Go to the nearest hospital emergency room if fever/cough/breathlessness are severe or illness seems like a threat to life.

## 2019-10-25 NOTE — ED Provider Notes (Signed)
[facility]    CSN: [id number] Arrival date & time: 10/25/19  1400      History   Chief Complaint Chief Complaint  Patient presents with  . Fever    since tuesday - Tmax 102  . Cough    dry since tues    HPI Betty Stanton is a 34 y.o. female.   Four days ago patient developed a dry intermittent cough that has persisted.  She then developed fever to 101, with daily fever over 100.  She has had myalgias, fatigue, and constant headache.   She denies chest tightness, shortness of breath, and changes in taste/smell.    The history is provided by the patient.    Past Medical History:  Diagnosis Date  . Anxiety     Patient Active Problem List   Diagnosis Date Noted  . Anxiety about health 06/27/2019  . Breast pain, left 06/27/2019  . Breast mass in female 06/27/2019  . Elevated blood pressure reading 06/27/2019  . Tachycardia 06/27/2019  . Family history of breast cancer in mother 06/27/2019  . Plantar fasciitis 02/12/2017    No past surgical history on file.  OB History   No obstetric history on file.      Home Medications    Prior to Admission medications   Medication Sig Start Date End Date Taking? Authorizing Provider  Ascorbic Acid (VITAMIN C) 100 MG tablet Take 100 mg by mouth daily.   Yes [provider]  EVENING PRIMROSE OIL PO Take by mouth.   Yes [provider]  Multiple Vitamin (MULTIVITAMIN) tablet Take 1 tablet by mouth daily.   Yes [provider]    Family History Family History  Problem Relation Age of Onset  . Cancer Mother        breast  . Breast cancer Mother   . Heart disease Father   . Breast cancer Maternal Grandmother     Social History Social History   Tobacco Use  . Smoking status: Never Smoker  . Smokeless tobacco: Never Used  Substance Use Topics  . Alcohol use: Never  . Drug use: No     Allergies   Penicillins   Review of Systems Review of Systems + minimal sore  throat + mild cough No pleuritic pain No wheezing + mild nasal congestion No post-nasal drainage No sinus pain/pressure No itchy/red eyes No earache No hemoptysis No SOB + fever, + chills No nausea No vomiting No abdominal pain No diarrhea No urinary symptoms No skin rash + fatigue + myalgias + headache Used OTC meds (Ibuprofen and Tylenol) without relief   Physical Exam Triage Vital Signs ED Triage Vitals  Enc Vitals Group     BP 10/25/19 1417 119/83     Pulse Rate 10/25/19 1417 (!) 107     Resp 10/25/19 1417 18     Temp 10/25/19 1417 99.2 F (37.3 C)     Temp Source 10/25/19 1417 Oral     SpO2 10/25/19 1417 99 %     Weight 10/25/19 1419 175 lb (79.4 kg)     Height 10/25/19 1419 5\' 4"  (1.626 m)     Head Circumference --      Peak Flow --      Pain Score 10/25/19 1418 2     Pain Loc --      Pain Edu? --      Excl. in [place]? --    No data found.  Updated Vital Signs BP 119/83 (BP  Location: Left Arm)   Pulse (!) 107   Temp 99.2 F (37.3 C) (Oral)   Resp 18   Ht 5\' 4"  (1.626 m)   Wt 79.4 kg   LMP 10/22/2019 (Exact Date)   SpO2 99%   BMI 30.04 kg/m   Visual Acuity Right Eye Distance:   Left Eye Distance:   Bilateral Distance:    Right Eye Near:   Left Eye Near:    Bilateral Near:     Physical Exam Nursing notes and Vital Signs reviewed. Appearance:  Patient appears stated age, and in no acute distress Eyes:  Pupils are equal, round, and reactive to light and accomodation.  Extraocular movement is intact.  Conjunctivae are not inflamed  Ears:  Canals normal.  Tympanic membranes normal.  Nose:  Mildly congested turbinates.  No sinus tenderness.  Pharynx:  Normal Neck:  Supple.  Mildly enlarged lateral nodes are present, tender to palpation on the left.   Lungs:  Clear to auscultation.  Breath sounds are equal.  Moving air well. Heart:  Regular rate and rhythm without murmurs, rubs, or gallops.  Abdomen:  Nontender without masses or  hepatosplenomegaly.  Bowel sounds are present.  No CVA or flank tenderness.  Extremities:  No edema.  Skin:  No rash present.   UC Treatments / Results  Labs (all labs ordered are listed, but only abnormal results are displayed) Labs Reviewed  POC SARS CORONAVIRUS 2 AG -  ED - Abnormal; Notable for the following components:      Result Value   SARS Coronavirus 2 Ag Positive (*)    All other components within normal limits    EKG   Radiology No results found.  Procedures Procedures (including critical care time)  Medications Ordered in UC Medications - No data to display  Initial Impression / Assessment and Plan / UC Course  I have reviewed the triage vital signs and the nursing notes.  Pertinent labs & imaging results that were available during my care of the patient were reviewed by me and considered in my medical decision making (see chart for details).    Benign exam.  There is no evidence of bacterial infection today.  Treat symptomatically for now.   Final Clinical Impressions(s) / UC Diagnoses   Final diagnoses:  Fever, unspecified  COVID-19 virus infection     Discharge Instructions     Take plain guaifenesin (1200mg  extended release tabs such as Mucinex) twice daily, with plenty of water, for cough and congestion.  May add Pseudoephedrine (30mg , one or two every 4 to 6 hours) for sinus congestion.  Get adequate rest.   Try warm salt water gargles for sore throat.  Stop all antihistamines for now, and other non-prescription cough/cold preparations. May take Ibuprofen 200mg , 4 tabs every 8 hours with food for body aches, fever, headache, etc. May take Delsym Cough Suppressant at bedtime for nighttime cough.   Isolate yourself until COVID-19 test result is available.  If your COVID19 test is positive, then you are infected with the novel coronavirus and could give the virus to others.  Please continue isolation at home for at least 10 days since the start of  your symptoms.  Once you complete your 10 day quarantine, you may return to normal activities as long as you've not had a fever for over 24 hours (without taking fever reducing medicine) and your symptoms are improving. Please continue good preventive care measures, including:  frequent hand-washing, avoid touching your face, cover coughs/sneezes,  stay out of crowds and keep a 6 foot distance from others.  Go to the nearest hospital emergency room if fever/cough/breathlessness are severe or illness seems like a threat to life.     ED Prescriptions    None        Lattie Haw, MD 10/26/19 1704

## 2019-10-25 NOTE — ED Triage Notes (Signed)
C/o fever, dry cough, general malaise w/ mild sore throat since Tuesday - Tmax 102 - treating w/ tylenol & motrin OTC- last tylenol at 1200

## 2019-10-25 NOTE — Telephone Encounter (Signed)
Patient went to the UC. No further questions at this time.

## 2019-10-25 NOTE — Telephone Encounter (Signed)
Patient left vm stating she is having fever, cough, body aches and would like appt to discuss and possibly be tested for Covid.   Can we please schedule her to talk with someone today?  [phone].

## 2019-11-18 ENCOUNTER — Encounter: Payer: Self-pay | Admitting: Osteopathic Medicine

## 2019-11-18 ENCOUNTER — Telehealth (INDEPENDENT_AMBULATORY_CARE_PROVIDER_SITE_OTHER): Payer: No Typology Code available for payment source | Admitting: Osteopathic Medicine

## 2019-11-18 VITALS — Temp 100.6°F | Wt 175.0 lb

## 2019-11-18 DIAGNOSIS — R0602 Shortness of breath: Secondary | ICD-10-CM

## 2019-11-18 DIAGNOSIS — Z8616 Personal history of COVID-19: Secondary | ICD-10-CM

## 2019-11-18 MED ORDER — PREDNISONE 20 MG PO TABS: 20.0000 mg | ORAL_TABLET | Freq: Two times a day (BID) | ORAL | 0 refills | Status: DC

## 2019-11-18 MED ORDER — AZITHROMYCIN 250 MG PO TABS: ORAL_TABLET | ORAL | 0 refills | Status: DC

## 2019-11-18 NOTE — Progress Notes (Signed)
Virtual Visit via Video (App used: Doximity) Note  I connected with      Betty Stanton on 11/18/19 at 11:52 AM  by a telemedicine application and verified that I am speaking with the correct person using two identifiers.  Patient is at home I am in office   I discussed the limitations of evaluation and management by telemedicine and the availability of in person appointments. The patient expressed understanding and agreed to proceed.  History of Present Illness: Betty Stanton is a 34 y.o. female who would like to discuss SOB.    SOB on exertion for 1 months or so since COVID infection. Reports persistent elevated temp.     Observations/Objective: Temp (!) 100.6 F (38.1 C) (Oral)   Wt 175 lb (79.4 kg)   LMP 10/22/2019 (Exact Date)   BMI 30.04 kg/m  BP Readings from Last 3 Encounters:  10/25/19 119/83  06/26/19 (!) 145/77  04/06/17 125/83   Exam: Normal Speech.  NAD  Lab and Radiology Results No results found for this or any previous visit (from the past 72 hour(s)). No results found.     Assessment and Plan: 34 y.o. female with The encounter diagnosis was SOB (shortness of breath) on exertion.   Doesn't feel able to go to work. I'm happy to fill out forms, but I also recommended eval in office / CT chest to r/o PE/PNA. Pt declined d/t financial issues. I advised I can send abx and steroid burst but we may be missing something substantial or life threatening. Patient reports "i'm not that bad" and again declined further workup. ER precautions reviewed.    PDMP not reviewed this encounter. No orders of the defined types were placed in this encounter.  Meds ordered this encounter  Medications  . azithromycin (ZITHROMAX) 250 MG tablet    Sig: 2 tabs po x1 on Day 1, then 1 tab po daily on Days 2 - 5    Dispense:  6 tablet    Refill:  0  . predniSONE (DELTASONE) 20 MG tablet    Sig: Take 1 tablet (20 mg total) by mouth 2 (two) times daily with a meal.   Dispense:  10 tablet    Refill:  0      Follow Up Instructions: Return if symptoms worsen or fail to improve.    I discussed the assessment and treatment plan with the patient. The patient was provided an opportunity to ask questions and all were answered. The patient agreed with the plan and demonstrated an understanding of the instructions.   The patient was advised to call back or seek an in-person evaluation if any new concerns, if symptoms worsen or if the condition fails to improve as anticipated.  30 minutes of non-face-to-face time was provided during this encounter.      . . . . . . . . . . . . . Marland Kitchen                   Historical information moved to improve visibility of documentation.  Past Medical History:  Diagnosis Date  . Anxiety    No past surgical history on file. Social History   Tobacco Use  . Smoking status: Never Smoker  . Smokeless tobacco: Never Used  Substance Use Topics  . Alcohol use: Never   family history includes Breast cancer in her maternal grandmother and mother; Cancer in her mother; Heart disease in her father.  Medications: Current Outpatient Medications  Medication  Sig Dispense Refill  . Ascorbic Acid (VITAMIN C) 100 MG tablet Take 100 mg by mouth daily.    Marland Kitchen EVENING PRIMROSE OIL PO Take by mouth.    . Multiple Vitamin (MULTIVITAMIN) tablet Take 1 tablet by mouth daily.    Marland Kitchen VITAMIN D PO Take by mouth.    Marland Kitchen azithromycin (ZITHROMAX) 250 MG tablet 2 tabs po x1 on Day 1, then 1 tab po daily on Days 2 - 5 6 tablet 0  . predniSONE (DELTASONE) 20 MG tablet Take 1 tablet (20 mg total) by mouth 2 (two) times daily with a meal. 10 tablet 0   No current facility-administered medications for this visit.   Allergies  Allergen Reactions  . Penicillins

## 2019-11-18 NOTE — Progress Notes (Signed)
Called pt at 1110 am. No answer, left a brief vm msg.

## 2019-11-25 ENCOUNTER — Telehealth: Payer: Self-pay | Admitting: *Deleted

## 2019-11-25 NOTE — Telephone Encounter (Signed)
Patient left a message wanting to ask questions about medication that was given at her appointment along with the status of the form she dropped off.  I called patient back and we discussed the antibiotic and steroid that was given to her and why she would have received it.  Patient voiced understanding.  Will check with MD on form status.  Kamare Caspers,CMA

## 2019-11-27 NOTE — Telephone Encounter (Signed)
I looked through everything in my office, I cannot find any form that references this patient.  She and I had a virtual visit 11/18/2019 point we discussed some persistent respiratory symptoms and I treated for possible pneumonia/post viral cough.  If she dropped off a form, it never made it to my in basket, unless someone already took care of this.  Rosalio Loud, do you remember anything about this?

## 2019-12-02 NOTE — Telephone Encounter (Signed)
Do you know anything about this? 

## 2019-12-03 ENCOUNTER — Encounter: Payer: Self-pay | Admitting: Nurse Practitioner

## 2019-12-03 ENCOUNTER — Ambulatory Visit (INDEPENDENT_AMBULATORY_CARE_PROVIDER_SITE_OTHER): Payer: BC Managed Care – PPO | Admitting: Nurse Practitioner

## 2019-12-03 ENCOUNTER — Other Ambulatory Visit: Payer: Self-pay

## 2019-12-03 ENCOUNTER — Encounter: Payer: Self-pay | Admitting: Osteopathic Medicine

## 2019-12-03 VITALS — BP 131/80 | HR 103 | Temp 98.5°F | Ht 64.75 in | Wt 185.9 lb

## 2019-12-03 DIAGNOSIS — G933 Postviral fatigue syndrome: Secondary | ICD-10-CM

## 2019-12-03 DIAGNOSIS — R002 Palpitations: Secondary | ICD-10-CM | POA: Diagnosis not present

## 2019-12-03 DIAGNOSIS — R0602 Shortness of breath: Secondary | ICD-10-CM

## 2019-12-03 DIAGNOSIS — G9331 Postviral fatigue syndrome: Secondary | ICD-10-CM

## 2019-12-03 MED ORDER — HYDROXYZINE HCL 25 MG PO TABS: 25.0000 mg | ORAL_TABLET | Freq: Every evening | ORAL | 1 refills | Status: DC | PRN

## 2019-12-03 NOTE — Patient Instructions (Signed)
Palpitations Palpitations are feelings that your heartbeat is not normal. Your heartbeat may feel like it is:  Uneven.  Faster than normal.  Fluttering.  Skipping a beat. This is usually not a serious problem. In some cases, you may need tests to rule out any serious problems. Follow these instructions at home: Pay attention to any changes in your condition. Take these actions to help manage your symptoms: Eating and drinking  Avoid: ? Coffee, tea, soft drinks, and energy drinks. ? Chocolate. ? Alcohol. ? Diet pills. Lifestyle   Try to lower your stress. These things can help you relax: ? Yoga. ? Deep breathing and meditation. ? Exercise. ? Using words and images to create positive thoughts (guided imagery). ? Using your mind to control things in your body (biofeedback).  Do not use drugs.  Get plenty of rest and sleep. Keep a regular bed time. General instructions   Take over-the-counter and prescription medicines only as told by your doctor.  Do not use any products that contain nicotine or tobacco, such as cigarettes and e-cigarettes. If you need help quitting, ask your doctor.  Keep all follow-up visits as told by your doctor. This is important. You may need more tests if palpitations do not go away or get worse. Contact a doctor if:  Your symptoms last more than 24 hours.  Your symptoms occur more often. Get help right away if you:  Have chest pain.  Feel short of breath.  Have a very bad headache.  Feel dizzy.  Pass out (faint). Summary  Palpitations are feelings that your heartbeat is uneven or faster than normal. It may feel like your heart is fluttering or skipping a beat.  Avoid food and drinks that may cause palpitations. These include caffeine, chocolate, and alcohol.  Try to lower your stress. Do not smoke or use drugs.  Get help right away if you faint or have chest pain, shortness of breath, a severe headache, or dizziness. This  information is not intended to replace advice given to you by your health care provider. Make sure you discuss any questions you have with your health care provider. Document Revised: 10/04/2017 Document Reviewed: 10/04/2017 Elsevier Patient Education  2020 [employer] Inc[employer]  

## 2019-12-03 NOTE — Telephone Encounter (Signed)
I have not received any paperwork for this pt. I have contacted pt, she will resend the paperwork via MyChart. Pt was scheduled with NP Les Pou this afternoon due to other concerns - palpitations, rapid heart rate and SOB on/off since Sunday, unsure if it's anxiety.

## 2019-12-03 NOTE — Progress Notes (Signed)
Acute Office Visit  Subjective:    Patient ID: Betty Stanton, female    DOB: 12/20/1985, 34 y.o.   MRN: [id number]  Chief Complaint  Patient presents with  . Palpitations    HPI Patient is in today for palpitations, chest pain, shortness of breath (worsening), dizziness, fatigue, intermittent neck pain, and low grade fever that have been ongoing since COVID diagnosis in mid- February of this year. She reports her COVID symptoms were mild, but she continues to have residual symptoms which are worrisome for her and causing some anxiety. She reports she is not sleeping well and often lays in bed and tries to get her heart to slow down. She reports that occasionally when she is experiencing palpations she feels a tingling down her left arm and possibly into her back. She has not had any episodes of syncope.  She reports episodes of palpitations and "irregular racing heart" twice since her COVID diagnosis. She has never had anything like this before. She does report that it could be anxiety, but she is very concerned.   She reports she has been eating and drinking normally and she has consciously avoided caffeine.   Past Medical History:  Diagnosis Date  . Anxiety     History reviewed. No pertinent surgical history.  Family History  Problem Relation Age of Onset  . Cancer Mother        breast  . Breast cancer Mother   . Heart disease Father   . Breast cancer Maternal Grandmother     Social History   Socioeconomic History  . Marital status: Single    Spouse name: Not on file  . Number of children: Not on file  . Years of education: Not on file  . Highest education level: Not on file  Occupational History  . Not on file  Tobacco Use  . Smoking status: Never Smoker  . Smokeless tobacco: Never Used  Substance and Sexual Activity  . Alcohol use: Never  . Drug use: No  . Sexual activity: Yes  Other Topics Concern  . Not on file  Social History Narrative  . Not on file    Social Determinants of Health   Financial Resource Strain:   . Difficulty of Paying Living Expenses:   Food Insecurity:   . Worried About Charity fundraiser in the Last Year:   . Arboriculturist in the Last Year:   Transportation Needs:   . Film/video editor (Medical):   Marland Kitchen Lack of Transportation (Non-Medical):   Physical Activity:   . Days of Exercise per Week:   . Minutes of Exercise per Session:   Stress:   . Feeling of Stress :   Social Connections:   . Frequency of Communication with Friends and Family:   . Frequency of Social Gatherings with Friends and Family:   . Attends Religious Services:   . Active Member of Clubs or Organizations:   . Attends Archivist Meetings:   Marland Kitchen Marital Status:   Intimate Partner Violence:   . Fear of Current or Ex-Partner:   . Emotionally Abused:   Marland Kitchen Physically Abused:   . Sexually Abused:     Outpatient Medications Prior to Visit  Medication Sig Dispense Refill  . Ascorbic Acid (VITAMIN C) 100 MG tablet Take 100 mg by mouth daily.    Marland Kitchen EVENING PRIMROSE OIL PO Take by mouth.    . Multiple Vitamin (MULTIVITAMIN) tablet Take 1 tablet by mouth daily.    Marland Kitchen  VITAMIN D PO Take by mouth.    Marland Kitchen azithromycin (ZITHROMAX) 250 MG tablet 2 tabs po x1 on Day 1, then 1 tab po daily on Days 2 - 5 (Patient not taking: Reported on 12/03/2019) 6 tablet 0  . predniSONE (DELTASONE) 20 MG tablet Take 1 tablet (20 mg total) by mouth 2 (two) times daily with a meal. (Patient not taking: Reported on 12/03/2019) 10 tablet 0   No facility-administered medications prior to visit.    Allergies  Allergen Reactions  . Penicillins Hives    Review of Systems  Constitutional: Positive for chills, fatigue and fever. Negative for appetite change.  HENT: Negative for congestion, ear pain, rhinorrhea, sinus pressure, sinus pain, sore throat and trouble swallowing.   Respiratory: Positive for cough, chest tightness and shortness of breath.    Cardiovascular: Positive for chest pain and palpitations. Negative for leg swelling.  Gastrointestinal: Negative for abdominal pain, constipation, diarrhea, nausea and vomiting.  Genitourinary: Negative for dysuria.  Musculoskeletal: Positive for back pain. Negative for arthralgias and myalgias.  Neurological: Positive for dizziness and light-headedness. Negative for tremors, syncope, weakness and headaches.  Psychiatric/Behavioral: Positive for sleep disturbance. Negative for agitation, confusion and decreased concentration. The patient is nervous/anxious.        Objective:    Physical Exam Vitals and nursing note reviewed.  Constitutional:      Appearance: Normal appearance. She is normal weight.  HENT:     Head: Normocephalic.     Nose: Nose normal.     Mouth/Throat:     Mouth: Mucous membranes are moist.     Pharynx: Oropharynx is clear.  Eyes:     Extraocular Movements: Extraocular movements intact.     Conjunctiva/sclera: Conjunctivae normal.     Pupils: Pupils are equal, round, and reactive to light.  Neck:     Thyroid: No thyromegaly.     Vascular: No carotid bruit.     Trachea: Trachea normal.  Cardiovascular:     Rate and Rhythm: Normal rate and regular rhythm.     Pulses: Normal pulses.     Heart sounds: Normal heart sounds. No murmur. No friction rub.  Pulmonary:     Effort: Pulmonary effort is normal. No respiratory distress.     Breath sounds: Normal breath sounds. No stridor. No wheezing, rhonchi or rales.  Abdominal:     General: Abdomen is flat. Bowel sounds are normal. There is no distension.     Palpations: Abdomen is soft.  Musculoskeletal:        General: Normal range of motion.     Cervical back: Normal range of motion. No edema, erythema, rigidity or crepitus. No pain with movement.     Right lower leg: No edema.     Left lower leg: No edema.  Lymphadenopathy:     Cervical: No cervical adenopathy.  Skin:    General: Skin is warm and dry.      Capillary Refill: Capillary refill takes less than 2 seconds.  Neurological:     General: No focal deficit present.     Mental Status: She is alert and oriented to person, place, and time.     Motor: No weakness.     Coordination: Coordination normal.     Gait: Gait normal.  Psychiatric:        Mood and Affect: Mood normal.        Behavior: Behavior normal.        Thought Content: Thought content normal.  Judgment: Judgment normal.     BP 131/80   Pulse (!) 103   Temp 98.5 F (36.9 C) (Oral)   Ht 5' 4.75" (1.645 m)   Wt 185 lb 14.4 oz (84.3 kg)   LMP 11/18/2019   SpO2 100%   BMI 31.17 kg/m  Wt Readings from Last 3 Encounters:  12/03/19 185 lb 14.4 oz (84.3 kg)  11/18/19 175 lb (79.4 kg)  10/25/19 175 lb (79.4 kg)    Health Maintenance Due  Topic Date Due  . HIV Screening  Never done    There are no preventive care reminders to display for this patient.   No results found for: TSH No results found for: WBC, HGB, HCT, MCV, PLT No results found for: NA, K, CHLORIDE, CO2, GLUCOSE, BUN, CREATININE, BILITOT, ALKPHOS, AST, ALT, PROT, ALBUMIN, CALCIUM, ANIONGAP, EGFR, GFR No results found for: CHOL No results found for: HDL No results found for: LDLCALC No results found for: TRIG No results found for: CHOLHDL No results found for: HGBA1C     Assessment & Plan:   1. Palpitations Palpitations of unknown etiology. EKG performed in office today shows normal sinus rhythm with no signs of ST elevation or depression, normal QRS, normal ST, normal QT. Unable to rule out cardiac, pulmonary, or anxiety based etiology. Will obtain labs today to rule out anemia, electrolyte imbalance, kidney dysfunction, or thyroid issue.  Chest CT angio ordered to rule out pulmonary embolism.  Will notify patient with results of tests and develop a plan of care based on the results.  - EKG 12-Lead - CBC With Differential - COMPLETE METABOLIC PANEL WITH GFR - TSH - CT Angio Chest W/Cm  &/Or Wo Cm; Future - hydrOXYzine (ATARAX/VISTARIL) 25 MG tablet; Take 1 tablet (25 mg total) by mouth at bedtime as needed and may repeat dose one time if needed.  Dispense: 60 tablet; Refill: 1  2. Shortness of breath Continued shortness of breath post COVID-19 infection with worsening symptoms.  Lungs sound clear today. Will obtain labs and ensure there is no hematologic etiology causing symptoms.  Chest CT angio ordered to rule out pulmonary embolism.  Will determine next plan of care once test results received.  - CBC With Differential - COMPLETE METABOLIC PANEL WITH GFR - TSH - CT Angio Chest W/Cm &/Or Wo Cm; Future  3. Post viral syndrome Continued, prolonged symptoms associated with COVID-19 infection from mid-February. Testing performed to rule out specific etiology of symptoms.  Follow-up if symptoms persist or worsen.   Return if symptoms worsen or fail to improve.  Orma Render, NP

## 2019-12-12 ENCOUNTER — Telehealth: Payer: Self-pay

## 2019-12-12 NOTE — Telephone Encounter (Signed)
Pt states she that her employer has not received the documents sent via MyChart.   These forms are needed due to patient being diagnosed with COVID to secure her employment.

## 2019-12-12 NOTE — Telephone Encounter (Signed)
It's in the fax box

## 2020-01-03 ENCOUNTER — Encounter: Payer: Self-pay | Admitting: Nurse Practitioner

## 2020-04-21 ENCOUNTER — Other Ambulatory Visit: Payer: Self-pay | Admitting: Nurse Practitioner

## 2020-04-21 DIAGNOSIS — R002 Palpitations: Secondary | ICD-10-CM

## 2020-08-07 ENCOUNTER — Ambulatory Visit: Payer: Self-pay | Attending: Internal Medicine

## 2020-08-07 DIAGNOSIS — Z23 Encounter for immunization: Secondary | ICD-10-CM

## 2020-08-07 NOTE — Progress Notes (Signed)
   Covid-19 Vaccination Clinic  Name:  Betty Stanton    MRN: [id number] DOB: 1986-07-28  08/07/2020  Ms. Betty Stanton was observed post Covid-19 immunization for 15 minutes without incident. She was provided with Vaccine Information Sheet and instruction to access the V-Safe system.   Ms. Betty Stanton was instructed to call 911 with any severe reactions post vaccine: Marland Kitchen Difficulty breathing  . Swelling of face and throat  . A fast heartbeat  . A bad rash all over body  . Dizziness and weakness   Immunizations Administered    Name Date Dose VIS Date Route   Pfizer COVID-19 Vaccine 08/07/2020  4:42 PM 0.3 mL 06/24/2020 Intramuscular   Manufacturer: [employer], [employer]   Lot: [id number]   NDC: [address]-7

## 2020-08-11 ENCOUNTER — Telehealth: Payer: Self-pay | Admitting: Osteopathic Medicine

## 2020-08-11 NOTE — Telephone Encounter (Signed)
Patient called today to schedule annual physical. She stated that she did not want to schedule with pcp. She asked if she could switch her care to [facility]. She did not want to state a reason. I said that I would send a message to the providers and then someone would call her to schedule her annual.

## 2020-08-11 NOTE — Telephone Encounter (Signed)
I have never seen this patient. From previous discussions, I thought patients looking to transfer care to a different provider need to have a stated reason. If the reason is a complaint regarding their PCP, they should be directed to [place] for discussion and resolution.

## 2020-08-11 NOTE — Telephone Encounter (Signed)
Joy is correct, we do not switch just to switch.  If patients have any concerns, it needs to be brought up with Toniann Fail

## 2020-08-14 DIAGNOSIS — R0981 Nasal congestion: Secondary | ICD-10-CM | POA: Diagnosis not present

## 2020-08-14 DIAGNOSIS — R52 Pain, unspecified: Secondary | ICD-10-CM | POA: Diagnosis not present

## 2020-08-14 DIAGNOSIS — R5383 Other fatigue: Secondary | ICD-10-CM | POA: Diagnosis not present

## 2020-09-14 ENCOUNTER — Telehealth: Payer: Self-pay | Admitting: Osteopathic Medicine

## 2020-09-14 NOTE — Telephone Encounter (Signed)
Fine w/me-

## 2020-09-14 NOTE — Telephone Encounter (Signed)
This is fine with me   

## 2020-09-14 NOTE — Telephone Encounter (Signed)
Patient called last month inquiring about switching providers. She hadn't given me a specific reason, so I called her and left a voicemail. She finally called back today. I asked for a specific reason. She said there was a personality clash. She didn't feel that it was a good fit. I told her that Ander Slade was accepting new patient which is why she wanted to switch to her. Is it okay to switch her provider to Joy?

## 2020-09-30 DIAGNOSIS — Q100 Congenital ptosis: Secondary | ICD-10-CM | POA: Diagnosis not present

## 2020-09-30 DIAGNOSIS — D3131 Benign neoplasm of right choroid: Secondary | ICD-10-CM | POA: Diagnosis not present

## 2020-10-02 ENCOUNTER — Other Ambulatory Visit: Payer: Self-pay

## 2020-10-02 ENCOUNTER — Encounter: Payer: Self-pay | Admitting: Medical-Surgical

## 2020-10-02 ENCOUNTER — Ambulatory Visit (INDEPENDENT_AMBULATORY_CARE_PROVIDER_SITE_OTHER): Payer: BC Managed Care – PPO | Admitting: Medical-Surgical

## 2020-10-02 ENCOUNTER — Other Ambulatory Visit (HOSPITAL_COMMUNITY)
Admission: RE | Admit: 2020-10-02 | Discharge: 2020-10-02 | Disposition: A | Discharge status: Home / Self Care | Payer: BC Managed Care – PPO | Source: Ambulatory Visit | Attending: Medical-Surgical | Admitting: Medical-Surgical

## 2020-10-02 VITALS — BP 132/80 | HR 92 | Temp 98.6°F | Ht 66.0 in | Wt 186.4 lb

## 2020-10-02 DIAGNOSIS — Z124 Encounter for screening for malignant neoplasm of cervix: Secondary | ICD-10-CM

## 2020-10-02 DIAGNOSIS — Z83438 Family history of other disorder of lipoprotein metabolism and other lipidemia: Secondary | ICD-10-CM | POA: Diagnosis not present

## 2020-10-02 DIAGNOSIS — M940 Chondrocostal junction syndrome [Tietze]: Secondary | ICD-10-CM

## 2020-10-02 DIAGNOSIS — Z0000 Encounter for general adult medical examination without abnormal findings: Secondary | ICD-10-CM

## 2020-10-02 DIAGNOSIS — R1031 Right lower quadrant pain: Secondary | ICD-10-CM

## 2020-10-02 NOTE — Progress Notes (Signed)
HPI: Betty Stanton is a 35 y.o. female who  has a past medical history of Anxiety.  she presents to [facility] today, 10/02/20,  for chief complaint of: Annual physical exam  Dentist: none recently, on to do list Eye exam: December 2021, new glasses Exercise: none intentional Diet: eats all food groups, drinks water Pap smear: no abnormals, overdue Mammogram: done 07/2019 COVID vaccine: All DONE!!  Concerns:  Chest pain: Has had some intermittent dull/sharp chest pain in the left upper chest that only lasts for a couple of minutes before resolving. When she touches the area, has noted that it is sore.   RLQ pain: Has had intermittent pains in the RLQ that are sharp and stabbing. These are short lived but have occurred with enough regularity that she is concerned.   Past medical, surgical, social and family history reviewed:  Patient Active Problem List   Diagnosis Date Noted  . Anxiety about health 06/27/2019  . Breast pain, left 06/27/2019  . Breast mass in female 06/27/2019  . Elevated blood pressure reading 06/27/2019  . Tachycardia 06/27/2019  . Family history of breast cancer in mother 06/27/2019  . Plantar fasciitis 02/12/2017    History reviewed. No pertinent surgical history.  Social History   Tobacco Use  . Smoking status: Never Smoker  . Smokeless tobacco: Never Used  Substance Use Topics  . Alcohol use: Never    Family History  Problem Relation Age of Onset  . Cancer Mother        breast  . Breast cancer Mother   . Heart disease Father   . Breast cancer Maternal Grandmother      Current medication list and allergy/intolerance information reviewed:    Current Outpatient Medications  Medication Sig Dispense Refill  . Ascorbic Acid (VITAMIN C) 100 MG tablet Take 100 mg by mouth daily.    Marland Kitchen EVENING PRIMROSE OIL PO Take by mouth.    . hydrOXYzine (ATARAX/VISTARIL) 25 MG tablet TAKE 1 TABLET(25 MG) BY MOUTH AT  BEDTIME AS NEEDED. MAY REPEAT DOSE 1 TIME AS NEEDED 60 tablet 1  . Multiple Vitamin (MULTIVITAMIN) tablet Take 1 tablet by mouth daily.    Marland Kitchen VITAMIN D PO Take by mouth.     No current facility-administered medications for this visit.    Allergies  Allergen Reactions  . Penicillins Hives      Review of Systems:  Constitutional:  No  fever, no chills, No recent illness, No unintentional weight changes. No significant fatigue.   HEENT: + headache, no vision change, no hearing change, No sore throat, No  sinus pressure  Cardiac: No  chest pain, No  pressure, No palpitations, No  Orthopnea  Respiratory:  + shortness of breath. + Cough  Gastrointestinal: + RLQ abdominal pain, No  nausea, No  vomiting,  No  blood in stool, No  diarrhea, No  constipation   Musculoskeletal: No new myalgia/arthralgia  Skin: No  Rash, No other wounds/concerning lesions  Genitourinary: No  incontinence, No  abnormal genital bleeding, No abnormal genital discharge  Hem/Onc: No  easy bruising/bleeding, No  abnormal lymph node  Endocrine: No cold intolerance,  No heat intolerance. No polyuria/polydipsia/polyphagia   Neurologic: No  weakness, No  dizziness, No  slurred speech/focal weakness/facial droop  Psychiatric: No  concerns with depression, + concerns with anxiety, + sleep problems, + mood problems  Exam:  BP 132/80   Pulse 92   Temp 98.6 F (37 C)   Ht  5' 6"  (1.676 m)   Wt 186 lb 6.4 oz (84.6 kg)   LMP 09/11/2020   SpO2 98%   BMI 30.09 kg/m   Constitutional: VS see above. General Appearance: alert, well-developed, well-nourished, NAD  Eyes: Normal lids and conjunctive, non-icteric sclera  Ears, Nose, Mouth, Throat: MMM, Normal external inspection ears/nares/mouth/lips/gums. TM normal bilaterally.    Neck: No masses, trachea midline. No thyroid enlargement. No tenderness/mass appreciated. No lymphadenopathy  Respiratory: Normal respiratory effort. no wheeze, no rhonchi, no  rales  Cardiovascular: S1/S2 normal, no murmur, no rub/gallop auscultated. RRR. No lower extremity edema. No JVD. No abdominal aortic bruit.  Gastrointestinal: Nontender to LLQ but other quadrants with mild tenderness to moderate palpation, no masses. No hepatomegaly, no splenomegaly. No hernia appreciated. Bowel sounds normal. Rectal exam deferred.   Pelvic exam: normal external genitalia, vulva, vagina, cervix, uterus and adnexa, PAP: Pap smear done today, HPV test, exam chaperoned by Glennie Hawk, MA.  Musculoskeletal: Gait normal. No clubbing/cyanosis of digits.   Neurological: Normal balance/coordination. No tremor. No cranial nerve deficit on limited exam. Motor and sensation intact and symmetric. Cerebellar reflexes intact.   Skin: warm, dry, intact. No rash/ulcer. No concerning nevi or subq nodules on limited exam.    Psychiatric: Normal judgment/insight. Normal mood and affect. Oriented x3.    No results found for this or any previous visit (from the past 72 hour(s)).  No results found.   ASSESSMENT/PLAN:   1. Annual physical exam Checking CBC, CMP, and Lipid panel today. Wellness information provided with AVS. - CBC with Differential/Platelet - COMPLETE METABOLIC PANEL WITH GFR - Lipid panel  2. Screening for cervical cancer Pap smear with HPV co-testing today. - Cytology - PAP  3. Abdominal pain, RLQ (right lower quadrant) Low suspicion for appendicitis. Possible bowel or ovarian etiology. Area of tenderness consistent with possible ovarian cyst. Although not severe, patient is very worried related to health issues. Will get TVUS for evaluation.  - US Pelvic Complete With Transvaginal; Future  4. Acute costochondritis Tenderness over anterior ribs consistent with costochondritis. Manage with conservative measures. Will likely self-resolve.   Orders Placed This Encounter  Procedures  . US Pelvic Complete With Transvaginal  . CBC with Differential/Platelet  .  COMPLETE METABOLIC PANEL WITH GFR  . Lipid panel    No orders of the defined types were placed in this encounter.   Patient Instructions   Preventive Care 2-73 Years Old, Female Preventive care refers to lifestyle choices and visits with your health care provider that can promote health and wellness. This includes:  A yearly physical exam. This is also called an annual wellness visit.  Regular dental and eye exams.  Immunizations.  Screening for certain conditions.  Healthy lifestyle choices, such as: ? Eating a healthy diet. ? Getting regular exercise. ? Not using drugs or products that contain nicotine and tobacco. ? Limiting alcohol use. What can I expect for my preventive care visit? Physical exam Your health care provider may check your:  Height and weight. These may be used to calculate your BMI (body mass index). BMI is a measurement that tells if you are at a healthy weight.  Heart rate and blood pressure.  Body temperature.  Skin for abnormal spots. Counseling Your health care provider may ask you questions about your:  Past medical problems.  Family's medical history.  Alcohol, tobacco, and drug use.  Emotional well-being.  Home life and relationship well-being.  Sexual activity.  Diet, exercise, and sleep habits.  Work  and work environment.  Access to firearms.  Method of birth control.  Menstrual cycle.  Pregnancy history. What immunizations do I need? Vaccines are usually given at various ages, according to a schedule. Your health care provider will recommend vaccines for you based on your age, medical history, and lifestyle or other factors, such as travel or where you work.   What tests do I need? Blood tests  Lipid and cholesterol levels. These may be checked every 5 years starting at age 56.  Hepatitis C test.  Hepatitis B test. Screening  Diabetes screening. This is done by checking your blood sugar (glucose) after you have  not eaten for a while (fasting).  STD (sexually transmitted disease) testing, if you are at risk.  BRCA-related cancer screening. This may be done if you have a family history of breast, ovarian, tubal, or peritoneal cancers.  Pelvic exam and Pap test. This may be done every 3 years starting at age 51. Starting at age 73, this may be done every 5 years if you have a Pap test in combination with an HPV test. Talk with your health care provider about your test results, treatment options, and if necessary, the need for more tests.   Follow these instructions at home: Eating and drinking  Eat a healthy diet that includes fresh fruits and vegetables, whole grains, lean protein, and low-fat dairy products.  Take vitamin and mineral supplements as recommended by your health care provider.  Do not drink alcohol if: ? Your health care provider tells you not to drink. ? You are pregnant, may be pregnant, or are planning to become pregnant.  If you drink alcohol: ? Limit how much you have to 0-1 drink a day. ? Be aware of how much alcohol is in your drink. In the [place] one drink equals one 12 oz bottle of beer (355 mL), one 5 oz glass of wine (148 mL), or one 1 oz glass of hard liquor (44 mL).   Lifestyle  Take daily care of your teeth and gums. Brush your teeth every morning and night with fluoride toothpaste. Floss one time each day.  Stay active. Exercise for at least 30 minutes 5 or more days each week.  Do not use any products that contain nicotine or tobacco, such as cigarettes, e-cigarettes, and chewing tobacco. If you need help quitting, ask your health care provider.  Do not use drugs.  If you are sexually active, practice safe sex. Use a condom or other form of protection to prevent STIs (sexually transmitted infections).  If you do not wish to become pregnant, use a form of birth control. If you plan to become pregnant, see your health care provider for a prepregnancy  visit.  Find healthy ways to cope with stress, such as: ? Meditation, yoga, or listening to music. ? Journaling. ? Talking to a trusted person. ? Spending time with friends and family. Safety  Always wear your seat belt while driving or riding in a vehicle.  Do not drive: ? If you have been drinking alcohol. Do not ride with someone who has been drinking. ? When you are tired or distracted. ? While texting.  Wear a helmet and other protective equipment during sports activities.  If you have firearms in your house, make sure you follow all gun safety procedures.  Seek help if you have been physically or sexually abused. What's next?  Go to your health care provider once a year for an annual wellness visit.  Ask your health care provider how often you should have your eyes and teeth checked.  Stay up to date on all vaccines. This information is not intended to replace advice given to you by your health care provider. Make sure you discuss any questions you have with your health care provider. Document Revised: 04/19/2020 Document Reviewed: 05/03/2018 Elsevier Patient Education  2021 [employer].   Follow-up plan: Return in about 1 year (around 10/02/2021) for annual physical exam or sooner if needed.  Clearnce Sorrel, DNP, APRN, FNP-BC [facility]

## 2020-10-02 NOTE — Patient Instructions (Signed)
Preventive Care 21-34 Years Old, Female Preventive care refers to lifestyle choices and visits with your health care provider that can promote health and wellness. This includes:  A yearly physical exam. This is also called an annual wellness visit.  Regular dental and eye exams.  Immunizations.  Screening for certain conditions.  Healthy lifestyle choices, such as: ? Eating a healthy diet. ? Getting regular exercise. ? Not using drugs or products that contain nicotine and tobacco. ? Limiting alcohol use. What can I expect for my preventive care visit? Physical exam Your health care provider may check your:  Height and weight. These may be used to calculate your BMI (body mass index). BMI is a measurement that tells if you are at a healthy weight.  Heart rate and blood pressure.  Body temperature.  Skin for abnormal spots. Counseling Your health care provider may ask you questions about your:  Past medical problems.  Family's medical history.  Alcohol, tobacco, and drug use.  Emotional well-being.  Home life and relationship well-being.  Sexual activity.  Diet, exercise, and sleep habits.  Work and work environment.  Access to firearms.  Method of birth control.  Menstrual cycle.  Pregnancy history. What immunizations do I need? Vaccines are usually given at various ages, according to a schedule. Your health care provider will recommend vaccines for you based on your age, medical history, and lifestyle or other factors, such as travel or where you work.   What tests do I need? Blood tests  Lipid and cholesterol levels. These may be checked every 5 years starting at age 20.  Hepatitis C test.  Hepatitis B test. Screening  Diabetes screening. This is done by checking your blood sugar (glucose) after you have not eaten for a while (fasting).  STD (sexually transmitted disease) testing, if you are at risk.  BRCA-related cancer screening. This may be  done if you have a family history of breast, ovarian, tubal, or peritoneal cancers.  Pelvic exam and Pap test. This may be done every 3 years starting at age 21. Starting at age 30, this may be done every 5 years if you have a Pap test in combination with an HPV test. Talk with your health care provider about your test results, treatment options, and if necessary, the need for more tests.   Follow these instructions at home: Eating and drinking  Eat a healthy diet that includes fresh fruits and vegetables, whole grains, lean protein, and low-fat dairy products.  Take vitamin and mineral supplements as recommended by your health care provider.  Do not drink alcohol if: ? Your health care provider tells you not to drink. ? You are pregnant, may be pregnant, or are planning to become pregnant.  If you drink alcohol: ? Limit how much you have to 0-1 drink a day. ? Be aware of how much alcohol is in your drink. In the [place] one drink equals one 12 oz bottle of beer (355 mL), one 5 oz glass of wine (148 mL), or one 1 oz glass of hard liquor (44 mL).   Lifestyle  Take daily care of your teeth and gums. Brush your teeth every morning and night with fluoride toothpaste. Floss one time each day.  Stay active. Exercise for at least 30 minutes 5 or more days each week.  Do not use any products that contain nicotine or tobacco, such as cigarettes, e-cigarettes, and chewing tobacco. If you need help quitting, ask your health care provider.  Do not   use drugs.  If you are sexually active, practice safe sex. Use a condom or other form of protection to prevent STIs (sexually transmitted infections).  If you do not wish to become pregnant, use a form of birth control. If you plan to become pregnant, see your health care provider for a prepregnancy visit.  Find healthy ways to cope with stress, such as: ? Meditation, yoga, or listening to music. ? Journaling. ? Talking to a trusted  person. ? Spending time with friends and family. Safety  Always wear your seat belt while driving or riding in a vehicle.  Do not drive: ? If you have been drinking alcohol. Do not ride with someone who has been drinking. ? When you are tired or distracted. ? While texting.  Wear a helmet and other protective equipment during sports activities.  If you have firearms in your house, make sure you follow all gun safety procedures.  Seek help if you have been physically or sexually abused. What's next?  Go to your health care provider once a year for an annual wellness visit.  Ask your health care provider how often you should have your eyes and teeth checked.  Stay up to date on all vaccines. This information is not intended to replace advice given to you by your health care provider. Make sure you discuss any questions you have with your health care provider. Document Revised: 04/19/2020 Document Reviewed: 05/03/2018 Elsevier Patient Education  2021 [employer] Inc.  

## 2020-10-03 LAB — COMPLETE METABOLIC PANEL WITH GFR
AG Ratio: 1.7 (calc) (ref 1.0–2.5)
ALT: 15 U/L (ref 6–29)
AST: 13 U/L (ref 10–30)
Albumin: 4.1 g/dL (ref 3.6–5.1)
Alkaline phosphatase (APISO): 49 U/L (ref 31–125)
BUN: 11 mg/dL (ref 7–25)
CO2: 25 mmol/L (ref 20–32)
Calcium: 9.6 mg/dL (ref 8.6–10.2)
Chloride: 107 mmol/L (ref 98–110)
Creat: 0.59 mg/dL (ref 0.50–1.10)
GFR, Est African American: 140 mL/min/{1.73_m2} (ref >=60)
GFR, Est Non African American: 120 mL/min/{1.73_m2} (ref >=60)
Globulin: 2.4 g/dL (calc) (ref 1.9–3.7)
Glucose, Bld: 79 mg/dL (ref 65–99)
Potassium: 3.9 mmol/L (ref 3.5–5.3)
Sodium: 139 mmol/L (ref 135–146)
Total Bilirubin: 0.5 mg/dL (ref 0.2–1.2)
Total Protein: 6.5 g/dL (ref 6.1–8.1)

## 2020-10-03 LAB — CBC WITH DIFFERENTIAL/PLATELET
Absolute Monocytes: 881 cells/uL (ref 200–950)
Basophils Absolute: 50 cells/uL (ref 0–200)
Basophils Relative: 0.5 %
Eosinophils Absolute: 109 cells/uL (ref 15–500)
Eosinophils Relative: 1.1 %
HCT: 42.4 % (ref 35.0–45.0)
Hemoglobin: 14.2 g/dL (ref 11.7–15.5)
Lymphs Abs: 3534 cells/uL (ref 850–3900)
MCH: 28.7 pg (ref 27.0–33.0)
MCHC: 33.5 g/dL (ref 32.0–36.0)
MCV: 85.8 fL (ref 80.0–100.0)
MPV: 11.1 fL (ref 7.5–12.5)
Monocytes Relative: 8.9 %
Neutro Abs: 5326 cells/uL (ref 1500–7800)
Neutrophils Relative %: 53.8 %
Platelets: 335 10*3/uL (ref 140–400)
RBC: 4.94 10*6/uL (ref 3.80–5.10)
RDW: 11.7 % (ref 11.0–15.0)
Total Lymphocyte: 35.7 %
WBC: 9.9 10*3/uL (ref 3.8–10.8)

## 2020-10-03 LAB — LIPID PANEL
Cholesterol: 170 mg/dL (ref <200)
HDL: 60 mg/dL (ref >=50)
LDL Cholesterol (Calc): 89 mg/dL (calc)
Non-HDL Cholesterol (Calc): 110 mg/dL (calc) (ref <130)
Total CHOL/HDL Ratio: 2.8 (calc) (ref <5.0)
Triglycerides: 118 mg/dL (ref <150)

## 2020-10-05 LAB — CYTOLOGY - PAP
Adequacy: ABSENT
Comment: NEGATIVE
Diagnosis: NEGATIVE
High risk HPV: NEGATIVE

## 2020-10-06 ENCOUNTER — Encounter: Payer: Self-pay | Admitting: Medical-Surgical

## 2020-10-06 DIAGNOSIS — M722 Plantar fascial fibromatosis: Secondary | ICD-10-CM

## 2020-10-30 ENCOUNTER — Ambulatory Visit: Payer: BC Managed Care – PPO | Admitting: Podiatry

## 2020-10-30 ENCOUNTER — Other Ambulatory Visit: Payer: Self-pay

## 2020-10-30 ENCOUNTER — Ambulatory Visit (INDEPENDENT_AMBULATORY_CARE_PROVIDER_SITE_OTHER): Payer: BC Managed Care – PPO

## 2020-10-30 DIAGNOSIS — M7732 Calcaneal spur, left foot: Secondary | ICD-10-CM

## 2020-10-30 DIAGNOSIS — G8929 Other chronic pain: Secondary | ICD-10-CM | POA: Diagnosis not present

## 2020-10-30 DIAGNOSIS — M79673 Pain in unspecified foot: Secondary | ICD-10-CM

## 2020-10-30 DIAGNOSIS — M7731 Calcaneal spur, right foot: Secondary | ICD-10-CM | POA: Diagnosis not present

## 2020-10-30 DIAGNOSIS — M722 Plantar fascial fibromatosis: Secondary | ICD-10-CM | POA: Diagnosis not present

## 2020-10-30 MED ORDER — MELOXICAM 15 MG PO TABS: 15.0000 mg | ORAL_TABLET | Freq: Every day | ORAL | 0 refills | Status: DC

## 2020-10-30 NOTE — Patient Instructions (Signed)

## 2020-11-02 NOTE — Progress Notes (Signed)
Subjective:   Patient ID: Betty Stanton, female   DOB: 35 y.o.   MRN: [id number]   HPI 35 year old female presents the office today for concerns of bilateral heel pain with left side worse than the right.  I have not seen her since 2018.  She said the pain never really completely went away but she has been back on her feet working night shift and having increased discomfort.  No recent injury or trauma.  No swelling.  Describes more of a chronic throbbing sensation on the right heel.  She has the plantar fascial brace but she states it was uncomfortable.  She has a night splint at home but she has not been using it recently.  She has no other concerns today.   Review of Systems  All other systems reviewed and are negative.  Past Medical History:  Diagnosis Date  . Anxiety     No past surgical history on file.   Current Outpatient Medications:  .  meloxicam (MOBIC) 15 MG tablet, Take 1 tablet (15 mg total) by mouth daily., Disp: 30 tablet, Rfl: 0 .  Ascorbic Acid (VITAMIN C) 100 MG tablet, Take 100 mg by mouth daily., Disp: , Rfl:  .  EVENING PRIMROSE OIL PO, Take by mouth., Disp: , Rfl:  .  hydrOXYzine (ATARAX/VISTARIL) 25 MG tablet, TAKE 1 TABLET(25 MG) BY MOUTH AT BEDTIME AS NEEDED. MAY REPEAT DOSE 1 TIME AS NEEDED, Disp: 60 tablet, Rfl: 1 .  Multiple Vitamin (MULTIVITAMIN) tablet, Take 1 tablet by mouth daily., Disp: , Rfl:  .  VITAMIN D PO, Take by mouth., Disp: , Rfl:   Allergies  Allergen Reactions  . Penicillins Hives         Objective:  Physical Exam  General: AAO x3, NAD  Dermatological: Skin is warm, dry and supple bilateral.There are no open sores, no preulcerative lesions, no rash or signs of infection present.  Vascular: Dorsalis Pedis artery and Posterior Tibial artery pedal pulses are 2/4 bilateral with immedate capillary fill time.There is no pain with calf compression, swelling, warmth, erythema.   Neruologic: Grossly intact via light touch bilateral.   Negative Tinel sign.  Musculoskeletal: There is tenderness palpation on plantar medial tubercle of the calcaneus at the insertion plantar fascial left side worse than right.  There is no pain with lateral compression of calcaneus.  No edema, erythema.  No pain with Achilles tendon.  Muscular strength 5/5 in all groups tested bilateral.  Gait: Unassisted, Nonantalgic.       Assessment:   35 year old female with plantar fasciitis     Plan:  -Treatment options discussed including all alternatives, risks, and complications -Etiology of symptoms were discussed -X-rays ordered and independently reviewed.  No evidence of acute fracture.  Awaiting radiology report. -Discussed steroid injection will be held off on this. Prescribed mobic. Discussed side effects of the medication and directed to stop if any are to occur and call the office.  -PT ordered -Discussed shoe modifications and orthotics. -Recommend continue night splint.  Vivi Barrack DPM

## 2020-12-04 ENCOUNTER — Emergency Department
Admission: EM | Admit: 2020-12-04 | Discharge: 2020-12-04 | Disposition: A | Discharge status: Home / Self Care | Payer: BC Managed Care – PPO | Source: Home / Self Care

## 2020-12-04 ENCOUNTER — Other Ambulatory Visit: Payer: Self-pay

## 2020-12-04 ENCOUNTER — Encounter: Payer: Self-pay | Admitting: Emergency Medicine

## 2020-12-04 ENCOUNTER — Encounter: Payer: Self-pay | Admitting: Medical-Surgical

## 2020-12-04 ENCOUNTER — Ambulatory Visit (INDEPENDENT_AMBULATORY_CARE_PROVIDER_SITE_OTHER): Payer: BC Managed Care – PPO

## 2020-12-04 DIAGNOSIS — D251 Intramural leiomyoma of uterus: Secondary | ICD-10-CM | POA: Diagnosis not present

## 2020-12-04 DIAGNOSIS — N83292 Other ovarian cyst, left side: Secondary | ICD-10-CM | POA: Diagnosis not present

## 2020-12-04 DIAGNOSIS — T148XXA Other injury of unspecified body region, initial encounter: Secondary | ICD-10-CM

## 2020-12-04 DIAGNOSIS — Z23 Encounter for immunization: Secondary | ICD-10-CM | POA: Diagnosis not present

## 2020-12-04 DIAGNOSIS — R1031 Right lower quadrant pain: Secondary | ICD-10-CM

## 2020-12-04 MED ORDER — TETANUS-DIPHTH-ACELL PERTUSSIS 5-2.5-18.5 LF-MCG/0.5 IM SUSY
0.5000 mL | PREFILLED_SYRINGE | Freq: Once | INTRAMUSCULAR | Status: AC
  Administered 2020-12-04: 0.5 mL via INTRAMUSCULAR

## 2020-12-04 NOTE — ED Triage Notes (Addendum)
Cut to right  forearm w/ box cutter at 0400 at [employer] - not actively bleeding  Dry  No redness or drainage  Due for TDap

## 2020-12-04 NOTE — Discharge Instructions (Addendum)
We have updated your Tdap vaccine in the office today  Follow up with this office or with primary care for increased swelling, redness, tenderness, warmth, drainage from the area.  Follow up in the ER for red streaking up your arm, high fever, trouble swallowing, trouble breathing, other concerning symptoms.

## 2020-12-04 NOTE — ED Provider Notes (Signed)
[facility]    CSN: [id number] Arrival date & time: 12/04/20  1711      History   Chief Complaint Chief Complaint  Patient presents with  . tdap  . Abrasion    HPI Ravenna Legore is a 35 y.o. female.   Reports that she cut her right forearm with a boxcutter at work at about 04:00 this morning. States that she is in need of Tdap vaccine. Denies pain, bleeding, warmth, erythema, tenderness, discharge from the area. Denies other symptoms.  ROS per HPI  The history is provided by the patient.    Past Medical History:  Diagnosis Date  . Anxiety     Patient Active Problem List   Diagnosis Date Noted  . Anxiety about health 06/27/2019  . Breast pain, left 06/27/2019  . Breast mass in female 06/27/2019  . Elevated blood pressure reading 06/27/2019  . Tachycardia 06/27/2019  . Family history of breast cancer in mother 06/27/2019  . Plantar fasciitis 02/12/2017    No past surgical history on file.  OB History   No obstetric history on file.      Home Medications    Prior to Admission medications   Medication Sig Start Date End Date Taking? Authorizing Provider  Ascorbic Acid (VITAMIN C) 100 MG tablet Take 100 mg by mouth daily.    [provider]  EVENING PRIMROSE OIL PO Take by mouth.    [provider]  hydrOXYzine (ATARAX/VISTARIL) 25 MG tablet TAKE 1 TABLET(25 MG) BY MOUTH AT BEDTIME AS NEEDED. MAY REPEAT DOSE 1 TIME AS NEEDED 04/23/20   Early, Sung Amabile, NP  meloxicam (MOBIC) 15 MG tablet Take 1 tablet (15 mg total) by mouth daily. 10/30/20 10/30/21  Vivi Barrack, DPM  Multiple Vitamin (MULTIVITAMIN) tablet Take 1 tablet by mouth daily.    [provider]  VITAMIN D PO Take by mouth.    [provider]    Family History Family History  Problem Relation Age of Onset  . Cancer Mother        breast  . Breast cancer Mother   . Heart disease Father   . Breast cancer Maternal Grandmother     Social  History Social History   Tobacco Use  . Smoking status: Never Smoker  . Smokeless tobacco: Never Used  Vaping Use  . Vaping Use: Never used  Substance Use Topics  . Alcohol use: Never  . Drug use: No     Allergies   Penicillins   Review of Systems Review of Systems   Physical Exam Triage Vital Signs ED Triage Vitals  Enc Vitals Group     BP      Pulse      Resp      Temp      Temp src      SpO2      Weight      Height      Head Circumference      Peak Flow      Pain Score      Pain Loc      Pain Edu?      Excl. in GC?    No data found.  Updated Vital Signs BP 116/80 (BP Location: Left Arm)   Pulse 77   Temp 98.4 F (36.9 C) (Oral)   Resp 15   LMP 12/03/2020 (Exact Date)   SpO2 98%       Physical Exam Vitals and nursing note reviewed.  Constitutional:  General: She is not in acute distress.    Appearance: Normal appearance. She is well-developed. She is not ill-appearing.  HENT:     Head: Normocephalic and atraumatic.     Nose: Nose normal.     Mouth/Throat:     Mouth: Mucous membranes are moist.     Pharynx: Oropharynx is clear.  Eyes:     Extraocular Movements: Extraocular movements intact.     Conjunctiva/sclera: Conjunctivae normal.     Pupils: Pupils are equal, round, and reactive to light.  Cardiovascular:     Rate and Rhythm: Normal rate and regular rhythm.     Heart sounds: No murmur heard.   Pulmonary:     Effort: Pulmonary effort is normal. No respiratory distress.     Breath sounds: Normal breath sounds.  Abdominal:     Palpations: Abdomen is soft.     Tenderness: There is no abdominal tenderness.  Musculoskeletal:        General: Normal range of motion.     Cervical back: Normal range of motion and neck supple.  Skin:    General: Skin is warm and dry.     Capillary Refill: Capillary refill takes less than 2 seconds.     Findings: Abrasion present.          Comments: Small abrasion to R forearm. No bleeding,  drainage, erythema, tenderness, heat to the area  Neurological:     General: No focal deficit present.     Mental Status: She is alert and oriented to person, place, and time.  Psychiatric:        Mood and Affect: Mood normal.        Thought Content: Thought content normal.      UC Treatments / Results  Labs (all labs ordered are listed, but only abnormal results are displayed) Labs Reviewed - No data to display  EKG   Radiology No results found.  Procedures Procedures (including critical care time)  Medications Ordered in UC Medications  Tdap (BOOSTRIX) injection 0.5 mL (0.5 mLs Intramuscular Given 12/04/20 1734)    Initial Impression / Assessment and Plan / UC Course  I have reviewed the triage vital signs and the nursing notes.  Pertinent labs & imaging results that were available during my care of the patient were reviewed by me and considered in my medical decision making (see chart for details).    Abrasion Need for Tdap vaccine  We have updated your Tdap vaccine in the office today  Follow up with this office or with primary care for increased swelling, redness, tenderness, warmth, drainage from the area.  Follow up in the ER for red streaking up your arm, high fever, trouble swallowing, trouble breathing, other concerning symptoms.  Final Clinical Impressions(s) / UC Diagnoses   Final diagnoses:  Need for Tdap vaccination  Abrasion     Discharge Instructions     We have updated your Tdap vaccine in the office today  Follow up with this office or with primary care for increased swelling, redness, tenderness, warmth, drainage from the area.  Follow up in the ER for red streaking up your arm, high fever, trouble swallowing, trouble breathing, other concerning symptoms.     ED Prescriptions    None     PDMP not reviewed this encounter.   Moshe Cipro, NP 12/04/20 [phone]

## 2021-01-28 ENCOUNTER — Ambulatory Visit: Payer: BC Managed Care – PPO | Admitting: Rehabilitative and Restorative Service Providers"

## 2021-02-12 ENCOUNTER — Other Ambulatory Visit: Payer: Self-pay

## 2021-02-12 ENCOUNTER — Encounter: Payer: Self-pay | Admitting: Podiatry

## 2021-02-12 ENCOUNTER — Ambulatory Visit (INDEPENDENT_AMBULATORY_CARE_PROVIDER_SITE_OTHER): Payer: BC Managed Care – PPO | Admitting: Podiatry

## 2021-02-12 DIAGNOSIS — G8929 Other chronic pain: Secondary | ICD-10-CM

## 2021-02-12 DIAGNOSIS — M722 Plantar fascial fibromatosis: Secondary | ICD-10-CM

## 2021-02-12 DIAGNOSIS — M1990 Unspecified osteoarthritis, unspecified site: Secondary | ICD-10-CM | POA: Diagnosis not present

## 2021-02-12 DIAGNOSIS — M79673 Pain in unspecified foot: Secondary | ICD-10-CM | POA: Diagnosis not present

## 2021-02-12 NOTE — Progress Notes (Signed)
Subjective: 35 year old female presents the office for follow evaluation of left heel pain, plantar fasciitis.  She states the right side is starting to hurt as well but the majority symptoms on her left side.  She states that stretching and icing was helping at first however is no longer helping.  She is not able to do physical therapy as it was not covered by insurance.  She had to leave work on June 17 as she states that after an hour been at work that he will start to hurt.  She thinks that she needs to be out of work for short time in order to get this to heal over that work.  Denies any systemic complaints such as fevers, chills, nausea, vomiting. No acute changes since last appointment, and no other complaints at this time.   Objective: AAO x3, NAD DP/PT pulses palpable bilaterally, CRT less than 3 seconds There is tenderness palpation on plantar medial tubercle to palpation at the insertion of plantar fashion of the left side.  Mild discomfort in the arch of the foot.  No pain with lateral compression of calcaneus.  No pain with standing.  Minimal discomfort on the right side of the calcaneus and insertion of plantar fascia.  No other areas of pinpoint tenderness.  MMT 5/5.  Equinus is present. No pain with calf compression, swelling, warmth, erythema  Assessment: Left chronic heel pain, plantar fasciitis  Plan: -All treatment options discussed with the patient including all alternatives, risks, complications.  -Offered steroid injection but she is hesitant to do this we held off on this.  Today given her prolonged symptoms recommend immobilization in cam boot which was dispensed.  I ordered an MRI as well.  Ordered blood work including ANA, HLA-B27, rheumatoid factor, ESR, CRP. -As she starts to feel better she can resume stretching, icing daily.  Discussed alternative treatments including EPAT and PRP unfortunate covered by insurance.  Surgery is a last resort. -We will complete FMLA  paperwork for 6 weeks.  She left work on February 19, 2021 we will keep her out for 6 weeks or sooner if able. -Patient encouraged to call the office with any questions, concerns, change in symptoms.   Trula Slade DPM

## 2021-02-15 LAB — HLA-B27 ANTIGEN: HLA-B27 Antigen: NEGATIVE

## 2021-02-15 LAB — SEDIMENTATION RATE: Sed Rate: 9 mm/h (ref 0–20)

## 2021-02-15 LAB — ANA: Anti Nuclear Antibody (ANA): NEGATIVE

## 2021-02-15 LAB — RHEUMATOID FACTOR: Rheumatoid fact SerPl-aCnc: <14 IU/mL (ref <14)

## 2021-02-15 LAB — C-REACTIVE PROTEIN: CRP: 1.2 mg/L (ref <8.0)

## 2021-02-16 DIAGNOSIS — M79676 Pain in unspecified toe(s): Secondary | ICD-10-CM

## 2021-03-09 ENCOUNTER — Other Ambulatory Visit: Payer: Self-pay

## 2021-03-09 ENCOUNTER — Ambulatory Visit: Payer: BC Managed Care – PPO | Admitting: Podiatry

## 2021-03-09 DIAGNOSIS — M722 Plantar fascial fibromatosis: Secondary | ICD-10-CM | POA: Diagnosis not present

## 2021-03-09 DIAGNOSIS — M79673 Pain in unspecified foot: Secondary | ICD-10-CM | POA: Diagnosis not present

## 2021-03-09 DIAGNOSIS — G8929 Other chronic pain: Secondary | ICD-10-CM | POA: Diagnosis not present

## 2021-03-12 NOTE — Progress Notes (Signed)
Subjective: 35 year old female presents the office today for follow-up evaluation of left heel pain.  She has been walking in the cam boot but still having discomfort upon the heel.  She was hoping that by wearing the boot at this point her pain would resolve but still having some discomfort.  She has been called out the MRI but she has not yet scheduled this.  No recent injury or trauma any changes. Denies any systemic complaints such as fevers, chills, nausea, vomiting. No acute changes since last appointment, and no other complaints at this time.   Objective: AAO x3, NAD DP/PT pulses palpable bilaterally, CRT less than 3 seconds Discontinuation of tenderness on the plantar medial tubercle of the calcaneus at the insertion of plantar fascia on the left foot.  No plantar fascial appears to be intact clinically.  There is no pain about the posterior calcaneus.  No pain Achilles tendon.  MMT 5/5.  Negative Tinel sign. No pain with calf compression, swelling, warmth, erythema  Assessment: Plantar fasciitis left side, rule out tear  Plan: -All treatment options discussed with the patient including all alternatives, risks, complications.  -Given ongoing symptoms I do still recommend MRI.  She is to call and schedule this.  If for some reason she is not able to the MRI of the cast we will do an ultrasound.  Continue cam boot for now as well as stretching, icing daily. -Blood work normal -Patient encouraged to call the office with any questions, concerns, change in symptoms.    Vivi Barrack DPM

## 2021-03-15 ENCOUNTER — Telehealth: Payer: Self-pay | Admitting: Podiatry

## 2021-03-15 NOTE — Telephone Encounter (Signed)
Patient wanted to know why the mri was sent for her ankle and not her foot. Please advise

## 2021-03-15 NOTE — Telephone Encounter (Signed)
Called and spoke with the patient and relayed the message per Dr Wagoner. Shaletha Humble 

## 2021-03-22 ENCOUNTER — Other Ambulatory Visit: Payer: Self-pay

## 2021-03-22 ENCOUNTER — Ambulatory Visit
Admission: RE | Admit: 2021-03-22 | Discharge: 2021-03-22 | Disposition: A | Discharge status: Home / Self Care | Payer: BC Managed Care – PPO | Source: Ambulatory Visit | Attending: Podiatry | Admitting: Podiatry

## 2021-03-22 DIAGNOSIS — M722 Plantar fascial fibromatosis: Secondary | ICD-10-CM

## 2021-03-22 DIAGNOSIS — R600 Localized edema: Secondary | ICD-10-CM | POA: Diagnosis not present

## 2021-03-22 DIAGNOSIS — G8929 Other chronic pain: Secondary | ICD-10-CM

## 2021-03-24 ENCOUNTER — Encounter: Payer: Self-pay | Admitting: Medical-Surgical

## 2021-03-30 ENCOUNTER — Telehealth: Payer: Self-pay | Admitting: Podiatry

## 2021-03-30 NOTE — Telephone Encounter (Signed)
Patient called and lvm on the nurses line stating she needed Dr. Ardelle Anton to call and discuss MRI results.

## 2021-03-31 ENCOUNTER — Encounter: Payer: Self-pay | Admitting: Podiatry

## 2021-05-05 ENCOUNTER — Encounter: Payer: Self-pay | Admitting: Podiatry

## 2021-08-06 ENCOUNTER — Other Ambulatory Visit: Payer: Self-pay

## 2021-08-06 ENCOUNTER — Emergency Department (INDEPENDENT_AMBULATORY_CARE_PROVIDER_SITE_OTHER): Payer: BC Managed Care – PPO

## 2021-08-06 ENCOUNTER — Emergency Department
Admission: EM | Admit: 2021-08-06 | Discharge: 2021-08-06 | Disposition: A | Discharge status: Home / Self Care | Payer: BC Managed Care – PPO | Source: Home / Self Care

## 2021-08-06 DIAGNOSIS — R519 Headache, unspecified: Secondary | ICD-10-CM

## 2021-08-06 DIAGNOSIS — M25519 Pain in unspecified shoulder: Secondary | ICD-10-CM | POA: Diagnosis not present

## 2021-08-06 DIAGNOSIS — S161XXA Strain of muscle, fascia and tendon at neck level, initial encounter: Secondary | ICD-10-CM | POA: Diagnosis not present

## 2021-08-06 DIAGNOSIS — R221 Localized swelling, mass and lump, neck: Secondary | ICD-10-CM | POA: Diagnosis not present

## 2021-08-06 DIAGNOSIS — R222 Localized swelling, mass and lump, trunk: Secondary | ICD-10-CM | POA: Diagnosis not present

## 2021-08-06 MED ORDER — BACLOFEN 10 MG PO TABS: 10.0000 mg | ORAL_TABLET | Freq: Three times a day (TID) | ORAL | 0 refills | Status: DC

## 2021-08-06 MED ORDER — PREDNISONE 20 MG PO TABS: ORAL_TABLET | ORAL | 0 refills | Status: DC

## 2021-08-06 NOTE — ED Triage Notes (Signed)
Pt c/o intermittent neck and upper back pain as well shoulder pain since Monday. Worsening in last couple days. Says sometimes she gets tension headaches. Taking motrin prn.

## 2021-08-06 NOTE — ED Provider Notes (Signed)
[facility]    CSN: [id number] Arrival date & time: 08/06/21  1133      History   Chief Complaint Chief Complaint  Patient presents with   Neck Pain    And upper back   Shoulder Pain    HPI Betty Stanton is a 35 y.o. female.   HPI 35 year old female presents with intermittent neck, upper back and shoulder pain for 5 days.  Worsening over the past couple days.  Ports infrequent tension headaches as well.  Past Medical History:  Diagnosis Date   Anxiety     Patient Active Problem List   Diagnosis Date Noted   Anxiety about health 06/27/2019   Breast pain, left 06/27/2019   Breast mass in female 06/27/2019   Elevated blood pressure reading 06/27/2019   Tachycardia 06/27/2019   Family history of breast cancer in mother 06/27/2019   Plantar fasciitis 02/12/2017    History reviewed. No pertinent surgical history.  OB History   No obstetric history on file.      Home Medications    Prior to Admission medications   Medication Sig Start Date End Date Taking? Authorizing Provider  baclofen (LIORESAL) 10 MG tablet Take 1 tablet (10 mg total) by mouth 3 (three) times daily. 08/06/21  Yes Trevor Iha, FNP  predniSONE (DELTASONE) 20 MG tablet Take 3 tabs PO daily x 3 days, then 2 tabs PO daily x 3 days, then 1 tab PO daily x 3 days 08/06/21  Yes Trevor Iha, FNP  Ascorbic Acid (VITAMIN C) 100 MG tablet Take 100 mg by mouth daily.    [provider]  EVENING PRIMROSE OIL PO Take by mouth.    [provider]  hydrOXYzine (ATARAX/VISTARIL) 25 MG tablet TAKE 1 TABLET(25 MG) BY MOUTH AT BEDTIME AS NEEDED. MAY REPEAT DOSE 1 TIME AS NEEDED 04/23/20   Early, Sung Amabile, NP  meloxicam (MOBIC) 15 MG tablet Take 1 tablet (15 mg total) by mouth daily. 10/30/20 10/30/21  Vivi Barrack, DPM  Multiple Vitamin (MULTIVITAMIN) tablet Take 1 tablet by mouth daily.    [provider]  VITAMIN D PO Take by mouth.    [provider]    Family  History Family History  Problem Relation Age of Onset   Cancer Mother        breast   Breast cancer Mother    Heart disease Father    Breast cancer Maternal Grandmother     Social History Social History   Tobacco Use   Smoking status: Never   Smokeless tobacco: Never  Vaping Use   Vaping Use: Never used  Substance Use Topics   Alcohol use: Never   Drug use: No     Allergies   Penicillins   Review of Systems Review of Systems  Musculoskeletal:        Intermittent neck bilateral shoulder and upper back pain x5 days.    Physical Exam Triage Vital Signs ED Triage Vitals  Enc Vitals Group     BP 08/06/21 1210 136/85     Pulse Rate 08/06/21 1210 90     Resp 08/06/21 1210 17     Temp 08/06/21 1210 99.1 F (37.3 C)     Temp Source 08/06/21 1210 Oral     SpO2 08/06/21 1210 100 %     Weight --      Height --      Head Circumference --      Peak Flow --  Pain Score 08/06/21 1211 0     Pain Loc --      Pain Edu? --      Excl. in [place]? --    No data found.  Updated Vital Signs BP 136/85 (BP Location: Right Arm)   Pulse 90   Temp 99.1 F (37.3 C) (Oral)   Resp 17   LMP 07/27/2021   SpO2 100%   Physical Exam Vitals and nursing note reviewed.  Constitutional:      Appearance: She is obese.  HENT:     Head: Normocephalic and atraumatic.     Nose: Nose normal.     Mouth/Throat:     Mouth: Mucous membranes are moist.     Pharynx: Oropharynx is clear.  Eyes:     Extraocular Movements: Extraocular movements intact.     Conjunctiva/sclera: Conjunctivae normal.     Pupils: Pupils are equal, round, and reactive to light.  Cardiovascular:     Rate and Rhythm: Normal rate and regular rhythm.     Pulses: Normal pulses.     Heart sounds: Normal heart sounds.  Pulmonary:     Effort: Pulmonary effort is normal.     Breath sounds: Normal breath sounds.  Musculoskeletal:        General: Normal range of motion.     Cervical back: Normal range of motion and  neck supple.  Skin:    General: Skin is warm and dry.  Neurological:     General: No focal deficit present.     Mental Status: She is alert and oriented to person, place, and time.     UC Treatments / Results  Labs (all labs ordered are listed, but only abnormal results are displayed) Labs Reviewed - No data to display  EKG   Radiology DG Cervical Spine Complete  Result Date: 08/06/2021 CLINICAL DATA:  Headache and shoulder pain for 5 days. No reported injury. EXAM: CERVICAL SPINE - COMPLETE 4+ VIEW COMPARISON:  None. FINDINGS: On the lateral view the cervical spine is visualized to the level of C7-T1. Straightening of the cervical spine, usually due to positioning and/or muscle spasm. Pre-vertebral soft tissues are within normal limits. No fracture is detected in the cervical spine. Dens is well positioned between the lateral masses of C1. Cervical disc heights are preserved, with no appreciable spondylosis. No cervical spine subluxation. No significant facet arthropathy. No appreciable foraminal stenosis. No aggressive-appearing focal osseous lesions. IMPRESSION: Straightening of the cervical spine, usually due to positioning and/or muscle spasm. No acute osseous abnormality. No significant degenerative changes. Electronically Signed   By: Delbert Phenix M.D.   On: 08/06/2021 12:37    Procedures Procedures (including critical care time)  Medications Ordered in UC Medications - No data to display  Initial Impression / Assessment and Plan / UC Course  I have reviewed the triage vital signs and the nursing notes.  Pertinent labs & imaging results that were available during my care of the patient were reviewed by me and considered in my medical decision making (see chart for details).     MDM: 1.  Acute strain of neck muscle, initial encounter-Rx'd Prednisone taper and Baclofen. Advised patient to take medication as directed with food to completion.  Advised patient may take Baclofen  daily or as needed for concurrent muscle spasms.  Encouraged patient to increase daily water intake preferably 48 ounces per day while taking these medications.  Advised/instructed patient to avoid offending activities that involve posterior neck, bilateral shoulders and upper  back for the next 7 to 10 days.  Patient discharged home, hemodynamically stable. Final Clinical Impressions(s) / UC Diagnoses   Final diagnoses:  Acute strain of neck muscle, initial encounter     Discharge Instructions      Advised patient to take medication as directed with food to completion.  Advised patient may take Baclofen daily or as needed for concurrent muscle spasms.  Encouraged patient to increase daily water intake preferably 48 ounces per day while taking these medications.  Advised/instructed patient to avoid offending activities that involve posterior neck, bilateral shoulders and upper back for the next 7 to 10 days.     ED Prescriptions     Medication Sig Dispense Auth. Provider   predniSONE (DELTASONE) 20 MG tablet Take 3 tabs PO daily x 3 days, then 2 tabs PO daily x 3 days, then 1 tab PO daily x 3 days 18 tablet Trevor Iha, FNP   baclofen (LIORESAL) 10 MG tablet Take 1 tablet (10 mg total) by mouth 3 (three) times daily. 30 each Trevor Iha, FNP      PDMP not reviewed this encounter.   Trevor Iha, FNP 08/06/21 1309

## 2021-08-06 NOTE — Discharge Instructions (Addendum)
Advised patient to take medication as directed with food to completion.  Advised patient may take Baclofen daily or as needed for concurrent muscle spasms.  Encouraged patient to increase daily water intake preferably 48 ounces per day while taking these medications.  Advised/instructed patient to avoid offending activities that involve posterior neck, bilateral shoulders and upper back for the next 7 to 10 days.

## 2021-10-05 ENCOUNTER — Emergency Department: Admit: 2021-10-05 | Discharge status: Absent without leave | Payer: Self-pay

## 2021-10-05 ENCOUNTER — Emergency Department
Admission: EM | Admit: 2021-10-05 | Discharge: 2021-10-05 | Disposition: A | Discharge status: Home / Self Care | Payer: BC Managed Care – PPO | Source: Home / Self Care | Attending: Family Medicine | Admitting: Family Medicine

## 2021-10-05 ENCOUNTER — Other Ambulatory Visit: Payer: Self-pay

## 2021-10-05 ENCOUNTER — Encounter: Payer: Self-pay | Admitting: Emergency Medicine

## 2021-10-05 DIAGNOSIS — R52 Pain, unspecified: Secondary | ICD-10-CM

## 2021-10-05 DIAGNOSIS — R5383 Other fatigue: Secondary | ICD-10-CM

## 2021-10-05 NOTE — ED Provider Notes (Signed)
[name]    CSN: [id number] Arrival date & time: 10/05/21  1237      History   Chief Complaint Chief Complaint  Patient presents with   Fatigue    HPI Betty Stanton is a 36 y.o. female.   HPI Patient states that she has had fever and fatigue for a week.  She has felt like she had a fever but on her thermometer readings it was never over 100.  No known exposure to COVID or flu.  COVID testing at home was negative.  She states that she is so tired she has not been able to work 3 days this week.  She does work nights. She is concerned she may have fatigue from "long COVID".  She had COVID in January 2021. She states that after she had COVID she had plantar fasciitis that kept her out of work for months.  She states she also had a slight brain fog that lasted almost a year. She had blood work a year ago with her annual physical.  She does not have any known anemia, thyroid disease, diabetes, inflammatory disease or reason for her fatigue. She states that she does have a lot of body aches and joint pain.  She states that after she works a shift, she takes a day to recover. No change in bowels.  No urinary complaints. Patient has regular menses.  Sometimes heavy.   Past Medical History:  Diagnosis Date   Anxiety     Patient Active Problem List   Diagnosis Date Noted   Anxiety about health 06/27/2019   Breast pain, left 06/27/2019   Breast mass in female 06/27/2019   Elevated blood pressure reading 06/27/2019   Tachycardia 06/27/2019   Family history of breast cancer in mother 06/27/2019   Plantar fasciitis 02/12/2017    History reviewed. No pertinent surgical history.  OB History   No obstetric history on file.      Home Medications    Prior to Admission medications   Medication Sig Start Date End Date Taking? Authorizing Provider  Ascorbic Acid (VITAMIN C) 100 MG tablet Take 100 mg by mouth daily.    [provider]  EVENING PRIMROSE OIL PO  Take by mouth.    [provider]  Multiple Vitamin (MULTIVITAMIN) tablet Take 1 tablet by mouth daily.    [provider]  VITAMIN D PO Take by mouth.    [provider]    Family History Family History  Problem Relation Age of Onset   Cancer Mother        breast   Breast cancer Mother    Heart disease Father    Breast cancer Maternal Grandmother     Social History Social History   Tobacco Use   Smoking status: Never   Smokeless tobacco: Never  Vaping Use   Vaping Use: Never used  Substance Use Topics   Alcohol use: Never   Drug use: No     Allergies   Penicillins   Review of Systems Review of Systems See HPI  Physical Exam Triage Vital Signs ED Triage Vitals  Enc Vitals Group     BP 10/05/21 1305 121/85     Pulse Rate 10/05/21 1305 94     Resp 10/05/21 1305 18     Temp 10/05/21 1305 98.6 F (37 C)     Temp Source 10/05/21 1305 Oral     SpO2 10/05/21 1305 100 %     Weight 10/05/21 1306  200 lb (90.7 kg)     Height 10/05/21 1306 5\' 4"  (1.626 m)     Head Circumference --      Peak Flow --      Pain Score 10/05/21 1306 0     Pain Loc --      Pain Edu? --      Excl. in [place]? --    No data found.  Updated Vital Signs BP 121/85 (BP Location: Left Arm)    Pulse 94    Temp 98.6 F (37 C) (Oral)    Resp 18    Ht 5\' 4"  (1.626 m)    Wt 90.7 kg    SpO2 100%    BMI 34.33 kg/m      Physical Exam Constitutional:      General: She is not in acute distress.    Appearance: She is well-developed.  HENT:     Head: Normocephalic and atraumatic.     Right Ear: Tympanic membrane and ear canal normal.     Left Ear: Tympanic membrane and ear canal normal.     Nose: Nose normal. No congestion.     Mouth/Throat:     Mouth: Mucous membranes are moist.     Pharynx: No posterior oropharyngeal erythema.  Eyes:     Conjunctiva/sclera: Conjunctivae normal.     Pupils: Pupils are equal, round, and reactive to light.     Comments: Right has  minimal conjunctival injection  Neck:     Comments: No adenopathy or thyromegaly Cardiovascular:     Rate and Rhythm: Normal rate and regular rhythm.     Heart sounds: Normal heart sounds.  Pulmonary:     Effort: Pulmonary effort is normal. No respiratory distress.     Breath sounds: Normal breath sounds.  Abdominal:     General: There is no distension.     Palpations: Abdomen is soft. There is no mass.     Tenderness: There is no abdominal tenderness.  Musculoskeletal:        General: Normal range of motion.     Cervical back: Normal range of motion.     Right lower leg: No edema.     Left lower leg: No edema.  Skin:    General: Skin is warm and dry.  Neurological:     General: No focal deficit present.     Mental Status: She is alert.     Gait: Gait normal.     Deep Tendon Reflexes: Reflexes normal.  Psychiatric:        Mood and Affect: Mood normal.     Comments: Concerned about health     UC Treatments / Results  Labs (all labs ordered are listed, but only abnormal results are displayed) Labs Reviewed  CBC WITH DIFFERENTIAL/PLATELET  COMPLETE METABOLIC PANEL WITH GFR  TSH  25-HYDROXY VITAMIN D LCMS D2+D3    EKG   Radiology No results found.  Procedures Procedures (including critical care time)  Medications Ordered in UC Medications - No data to display  Initial Impression / Assessment and Plan / UC Course  I have reviewed the triage vital signs and the nursing notes.  Pertinent labs & imaging results that were available during my care of the patient were reviewed by me and considered in my medical decision making (see chart for details).     I explained to the patient without an obvious viral infection fatigue is difficult to evaluate as an urgent care center.  She does not have  any definite symptoms that would suggest a viral infection except for may be the conjunctival injection.  It is too late to do COVID or flu testing since his symptoms been going  on for a week.  I recommend allowing me to do some screening blood work and follow-up with primary care Final Clinical Impressions(s) / UC Diagnoses   Final diagnoses:  Other fatigue  Generalized body aches     Discharge Instructions      Make an appointment to see Dr Charna Archer next week Check my Chart for test results    ED Prescriptions   None    PDMP not reviewed this encounter.   Raylene Everts, MD 10/05/21 1415

## 2021-10-05 NOTE — ED Triage Notes (Signed)
Fatigue, fever, headache, chills x 1 week, Vaccinated, No Flu shot

## 2021-10-05 NOTE — Discharge Instructions (Signed)
Make an appointment to see Dr Larinda Buttery next week Check my Chart for test results

## 2021-10-06 LAB — CBC WITH DIFFERENTIAL/PLATELET
Absolute Monocytes: 842 cells/uL (ref 200–950)
Basophils Absolute: 52 cells/uL (ref 0–200)
Basophils Relative: 0.5 %
Eosinophils Absolute: 166 cells/uL (ref 15–500)
Eosinophils Relative: 1.6 %
HCT: 42.6 % (ref 35.0–45.0)
Hemoglobin: 14.1 g/dL (ref 11.7–15.5)
Lymphs Abs: 3890 cells/uL (ref 850–3900)
MCH: 28.2 pg (ref 27.0–33.0)
MCHC: 33.1 g/dL (ref 32.0–36.0)
MCV: 85.2 fL (ref 80.0–100.0)
MPV: 10.8 fL (ref 7.5–12.5)
Monocytes Relative: 8.1 %
Neutro Abs: 5450 cells/uL (ref 1500–7800)
Neutrophils Relative %: 52.4 %
Platelets: 360 10*3/uL (ref 140–400)
RBC: 5 10*6/uL (ref 3.80–5.10)
RDW: 13 % (ref 11.0–15.0)
Total Lymphocyte: 37.4 %
WBC: 10.4 10*3/uL (ref 3.8–10.8)

## 2021-10-06 LAB — COMPLETE METABOLIC PANEL WITH GFR
AG Ratio: 1.7 (calc) (ref 1.0–2.5)
ALT: 14 U/L (ref 6–29)
AST: 14 U/L (ref 10–30)
Albumin: 4.2 g/dL (ref 3.6–5.1)
Alkaline phosphatase (APISO): 50 U/L (ref 31–125)
BUN: 12 mg/dL (ref 7–25)
CO2: 23 mmol/L (ref 20–32)
Calcium: 9.6 mg/dL (ref 8.6–10.2)
Chloride: 105 mmol/L (ref 98–110)
Creat: 0.66 mg/dL (ref 0.50–0.97)
Globulin: 2.5 g/dL (calc) (ref 1.9–3.7)
Glucose, Bld: 89 mg/dL (ref 65–99)
Potassium: 4.3 mmol/L (ref 3.5–5.3)
Sodium: 136 mmol/L (ref 135–146)
Total Bilirubin: 0.6 mg/dL (ref 0.2–1.2)
Total Protein: 6.7 g/dL (ref 6.1–8.1)
eGFR: 118 mL/min/{1.73_m2} (ref >=60)

## 2021-10-06 LAB — TSH: TSH: 3.2 mIU/L

## 2021-10-06 LAB — VITAMIN D 25 HYDROXY (VIT D DEFICIENCY, FRACTURES): Vit D, 25-Hydroxy: 44 ng/mL (ref 30–100)

## 2021-11-09 ENCOUNTER — Ambulatory Visit: Payer: BC Managed Care – PPO | Admitting: Medical-Surgical

## 2021-12-08 ENCOUNTER — Telehealth: Payer: BC Managed Care – PPO | Admitting: Nurse Practitioner

## 2021-12-08 DIAGNOSIS — R6889 Other general symptoms and signs: Secondary | ICD-10-CM | POA: Diagnosis not present

## 2021-12-08 MED ORDER — OSELTAMIVIR PHOSPHATE 75 MG PO CAPS: 75.0000 mg | ORAL_CAPSULE | Freq: Two times a day (BID) | ORAL | 0 refills | Status: DC

## 2021-12-08 MED ORDER — PROMETHAZINE-DM 6.25-15 MG/5ML PO SYRP: 5.0000 mL | ORAL_SOLUTION | Freq: Four times a day (QID) | ORAL | 0 refills | Status: DC | PRN

## 2021-12-08 NOTE — Progress Notes (Signed)
? ?Virtual Visit Consent  ? ?Betty Stanton, you are scheduled for a virtual visit with Mary-Margaret Daphine Deutscher, FNP, a [facility] provider, today.   ?  ?Just as with appointments in the office, your consent must be obtained to participate.  Your consent will be active for this visit and any virtual visit you may have with one of our providers in the next 365 days.   ?  ?If you have a MyChart account, a copy of this consent can be sent to you electronically.  All virtual visits are billed to your insurance company just like a traditional visit in the office.   ? ?As this is a virtual visit, video technology does not allow for your provider to perform a traditional examination.  This may limit your provider's ability to fully assess your condition.  If your provider identifies any concerns that need to be evaluated in person or the need to arrange testing (such as labs, EKG, etc.), we will make arrangements to do so.   ?  ?Although advances in technology are sophisticated, we cannot ensure that it will always work on either your end or our end.  If the connection with a video visit is poor, the visit may have to be switched to a telephone visit.  With either a video or telephone visit, we are not always able to ensure that we have a secure connection.    ? ?I need to obtain your verbal consent now.   Are you willing to proceed with your visit today? YES ?  ?Betty Stanton has provided verbal consent on 12/08/2021 for a virtual visit (video or telephone). ?  ?Mary-Margaret Daphine Deutscher, FNP  ? ?Date: 12/08/2021 8:30 AM ? ? ?Virtual Visit via Video Note  ? ?I, Mary-Margaret Daphine Deutscher, connected with Betty Stanton ([id number], 1986/03/12) on 12/08/21 at  8:30 AM EDT by a video-enabled telemedicine application and verified that I am speaking with the correct person using two identifiers. ? ?Location: ?Patient: Virtual Visit Location Patient: Home ?Provider: Virtual Visit Location Provider: Mobile ?  ?I discussed the limitations of  evaluation and management by telemedicine and the availability of in person appointments. The patient expressed understanding and agreed to proceed.   ? ?History of Present Illness: ?Betty Stanton is a 36 y.o. who identifies as a female who was assigned female at birth, and is being seen today for flu like symptoms. ? ?HPI: Influenza ?This is a new problem. Episode onset: 4 days. The problem has been waxing and waning. Associated symptoms include arthralgias, chills, congestion, coughing, a fever (> 102), myalgias and a sore throat. Nothing aggravates the symptoms. She has tried NSAIDs and acetaminophen for the symptoms. The treatment provided mild relief.   ?Review of Systems  ?Constitutional:  Positive for chills and fever (> 102).  ?HENT:  Positive for congestion and sore throat.   ?Respiratory:  Positive for cough.   ?Musculoskeletal:  Positive for arthralgias and myalgias.  ? ?Problems:  ?Patient Active Problem List  ? Diagnosis Date Noted  ? Anxiety about health 06/27/2019  ? Breast pain, left 06/27/2019  ? Breast mass in female 06/27/2019  ? Elevated blood pressure reading 06/27/2019  ? Tachycardia 06/27/2019  ? Family history of breast cancer in mother 06/27/2019  ? Plantar fasciitis 02/12/2017  ?  ?Allergies:  ?Allergies  ?Allergen Reactions  ? Penicillins Hives  ? ?Medications:  ?Current Outpatient Medications:  ?  Ascorbic Acid (VITAMIN C) 100 MG tablet, Take 100 mg by mouth daily., Disp: , Rfl:  ?  EVENING PRIMROSE OIL PO, Take by mouth., Disp: , Rfl:  ?  Multiple Vitamin (MULTIVITAMIN) tablet, Take 1 tablet by mouth daily., Disp: , Rfl:  ?  oseltamivir (TAMIFLU) 75 MG capsule, Take 1 capsule (75 mg total) by mouth 2 (two) times daily., Disp: 10 capsule, Rfl: 0 ?  VITAMIN D PO, Take by mouth., Disp: , Rfl:  ? ?Observations/Objective: ?Patient is well-developed, well-nourished in no acute distress.  ?Resting comfortably  at home.  ?Head is normocephalic, atraumatic.  ?No labored breathing.  ?Speech is  clear and coherent with logical content.  ?Patient is alert and oriented at baseline.  ?Nose erythematous ?Raspy voice ?Wet cough ? ?Assessment and Plan: ? ?Betty Stanton in today with chief complaint of Influenza ? ? ?1. Flu-like symptoms ?Out of treatment window for flu ?Patient will do covid test and notify us if positive. ?1. Take meds as prescribed ?2. Use a cool mist humidifier especially during the winter months and when heat has been humid. ?3. Use saline nose sprays frequently ?4. Saline irrigations of the nose can be very helpful if done frequently. ? * 4X daily for 1 week* ? * Use of a nettie pot can be helpful with this. Follow directions with this* ?5. Drink plenty of fluids ?6. Keep thermostat turn down low ?7.For any cough or congestion- promethazine DM ?8. For fever or aces or pains- take tylenol or ibuprofen appropriate for age and weight. ? * for fevers greater than 101 orally you may alternate ibuprofen and tylenol every  3 hours. ?  ?Meds ordered this encounter  ?Medications  ? promethazine-dextromethorphan (PROMETHAZINE-DM) 6.25-15 MG/5ML syrup  ?  Sig: Take 5 mLs by mouth 4 (four) times daily as needed for cough.  ?  Dispense:  118 mL  ?  Refill:  0  ?  Order Specific Question:   Supervising Provider  ?  Answer:   Eber Hong [[id number]]  ? ? ? ?Follow Up Instructions: ?I discussed the assessment and treatment plan with the patient. The patient was provided an opportunity to ask questions and all were answered. The patient agreed with the plan and demonstrated an understanding of the instructions.  A copy of instructions were sent to the patient via MyChart. ? ?The patient was advised to call back or seek an in-person evaluation if the symptoms worsen or if the condition fails to improve as anticipated. ? ?Time:  ?I spent 10 minutes with the patient via telehealth technology discussing the above problems/concerns.   ? ?Mary-Margaret Daphine Deutscher, FNP ? ?

## 2021-12-08 NOTE — Progress Notes (Signed)
E visit for Flu like symptoms ?  ?We are sorry that you are not feeling well.  Here is how we plan to help! ?Based on what you have shared with me it looks like you may have a respiratory virus that may be influenza. ? ?Influenza or ?the flu? is   an infection caused by a respiratory virus. The flu virus is highly contagious and persons who did not receive their yearly flu vaccination may ?catch? the flu from close contact. ? ?We have anti-viral medications to treat the viruses that cause this infection. They are not a ?cure? and only shorten the course of the infection. These prescriptions are most effective when they are given within the first 2 days of ?flu? symptoms. Antiviral medication are indicated if you have a high risk of complications from the flu. You should  also consider an antiviral medication if you are in close contact with someone who is at risk. These medications can help patients avoid complications from the flu  but have side effects that you should know. Possible side effects from Tamiflu or oseltamivir include nausea, vomiting, diarrhea, dizziness, headaches, eye redness, sleep problems or other respiratory symptoms. ?You should not take Tamiflu if you have an allergy to oseltamivir or any to the ingredients in Tamiflu. ? ?Based upon your symptoms and potential risk factors I have prescribed Oseltamivir (Tamiflu).  It has been sent to your designated pharmacy.  You will take one 75 mg capsule orally twice a day for the next 5 days.\ ? ?A work note is in your my chart. ? ?ANYONE WHO HAS FLU SYMPTOMS SHOULD: ?Stay home. The flu is highly contagious and going out or to work exposes others! ?Be sure to drink plenty of fluids. Water is fine as well as fruit juices, sodas and electrolyte beverages. You may want to stay away from caffeine or alcohol. If you are nauseated, try taking small sips of liquids. How do you know if you are getting enough fluid? Your urine should be a pale yellow or almost  colorless. ?Get rest. ?Taking a steamy shower or using a humidifier may help nasal congestion and ease sore throat pain. Using a saline nasal spray works much the same way. ?Cough drops, hard candies and sore throat lozenges may ease your cough. ?Line up a caregiver. Have someone check on you regularly. ? ? ?GET HELP RIGHT AWAY IF: ?You cannot keep down liquids or your medications. ?You become short of breath ?Your fell like you are going to pass out or loose consciousness. ?Your symptoms persist after you have completed your treatment plan ?MAKE SURE YOU  ?Understand these instructions. ?Will watch your condition. ?Will get help right away if you are not doing well or get worse. ? ?Your e-visit answers were reviewed by a board certified advanced clinical practitioner to complete your personal care plan.  Depending on the condition, your plan could have included both over the counter or prescription medications. ? ?If there is a problem please reply  once you have received a response from your provider. ? ?Your safety is important to [place].  If you have drug allergies check your prescription carefully.   ? ?You can use MyChart to ask questions about today?s visit, request a non-urgent call back, or ask for a work or school excuse for 24 hours related to this e-Visit. If it has been greater than 24 hours you will need to follow up with your provider, or enter a new e-Visit to address those concerns. ? ?  You will get an e-mail in the next two days asking about your experience.  I hope that your e-visit has been valuable and will speed your recovery. Thank you for using e-visits. ? ?5-10 minutes spent reviewing and documenting in chart. ? ?

## 2021-12-08 NOTE — Patient Instructions (Signed)
Influenza, Adult °Influenza is also called "the flu." It is an infection in the lungs, nose, and throat (respiratory tract). It spreads easily from person to person (is contagious). The flu causes symptoms that are like a cold, along with high fever and body aches. °What are the causes? °This condition is caused by the influenza virus. You can get the virus by: °Breathing in droplets that are in the air after a person infected with the flu coughed or sneezed. °Touching something that has the virus on it and then touching your mouth, nose, or eyes. °What increases the risk? °Certain things may make you more likely to get the flu. These include: °Not washing your hands often. °Having close contact with many people during cold and flu season. °Touching your mouth, eyes, or nose without first washing your hands. °Not getting a flu shot every year. °You may have a higher risk for the flu, and serious problems, such as a lung infection (pneumonia), if you: °Are older than 65. °Are pregnant. °Have a weakened disease-fighting system (immune system) because of a disease or because you are taking certain medicines. °Have a long-term (chronic) condition, such as: °Heart, kidney, or lung disease. °Diabetes. °Asthma. °Have a liver disorder. °Are very overweight (morbidly obese). °Have anemia. °What are the signs or symptoms? °Symptoms usually begin suddenly and last 4-14 days. They may include: °Fever and chills. °Headaches, body aches, or muscle aches. °Sore throat. °Cough. °Runny or stuffy (congested) nose. °Feeling discomfort in your chest. °Not wanting to eat as much as normal. °Feeling weak or tired. °Feeling dizzy. °Feeling sick to your stomach or throwing up. °How is this treated? °If the flu is found early, you can be treated with antiviral medicine. This can help to reduce how bad the illness is and how long it lasts. This may be given by mouth or through an IV tube. °Taking care of yourself at home can help your  symptoms get better. Your doctor may want you to: °Take over-the-counter medicines. °Drink plenty of fluids. °The flu often goes away on its own. If you have very bad symptoms or other problems, you may be treated in a hospital. °Follow these instructions at home: °  °Activity °Rest as needed. Get plenty of sleep. °Stay home from work or school as told by your doctor. °Do not leave home until you do not have a fever for 24 hours without taking medicine. °Leave home only to go to your doctor. °Eating and drinking °Take an ORS (oral rehydration solution). This is a drink that is sold at pharmacies and stores. °Drink enough fluid to keep your pee pale yellow. °Drink clear fluids in small amounts as you are able. Clear fluids include: °Water. °Ice chips. °Fruit juice mixed with water. °Low-calorie sports drinks. °Eat bland foods that are easy to digest. Eat small amounts as you are able. These foods include: °Bananas. °Applesauce. °Rice. °Lean meats. °Toast. °Crackers. °Do not eat or drink: °Fluids that have a lot of sugar or caffeine. °Alcohol. °Spicy or fatty foods. °General instructions °Take over-the-counter and prescription medicines only as told by your doctor. °Use a cool mist humidifier to add moisture to the air in your home. This can make it easier for you to breathe. °When using a cool mist humidifier, clean it daily. Empty water and replace with clean water. °Cover your mouth and nose when you cough or sneeze. °Wash your hands with soap and water often and for at least 20 seconds. This is also important after   you cough or sneeze. If you cannot use soap and water, use alcohol-based hand sanitizer. °Keep all follow-up visits. °How is this prevented? ° °Get a flu shot every year. You may get the flu shot in late summer, fall, or winter. Ask your doctor when you should get your flu shot. °Avoid contact with people who are sick during fall and winter. This is cold and flu season. °Contact a doctor if: °You get  new symptoms. °You have: °Chest pain. °Watery poop (diarrhea). °A fever. °Your cough gets worse. °You start to have more mucus. °You feel sick to your stomach. °You throw up. °Get help right away if you: °Have shortness of breath. °Have trouble breathing. °Have skin or nails that turn a bluish color. °Have very bad pain or stiffness in your neck. °Get a sudden headache. °Get sudden pain in your face or ear. °Cannot eat or drink without throwing up. °These symptoms may represent a serious problem that is an emergency. Get medical help right away. Call your local emergency services (911 in the [place] °Do not wait to see if the symptoms will go away. °Do not drive yourself to the hospital. °Summary °Influenza is also called "the flu." It is an infection in the lungs, nose, and throat. It spreads easily from person to person. °Take over-the-counter and prescription medicines only as told by your doctor. °Getting a flu shot every year is the best way to not get the flu. °This information is not intended to replace advice given to you by your health care provider. Make sure you discuss any questions you have with your health care provider. °Document Revised: 04/10/2020 Document Reviewed: 04/10/2020 °Elsevier Patient Education © 2022 [employer] ° °

## 2022-06-07 ENCOUNTER — Other Ambulatory Visit: Payer: Self-pay | Admitting: Medical-Surgical

## 2022-06-07 DIAGNOSIS — Z1231 Encounter for screening mammogram for malignant neoplasm of breast: Secondary | ICD-10-CM

## 2022-06-09 ENCOUNTER — Encounter: Payer: Self-pay | Admitting: Medical-Surgical

## 2022-06-09 ENCOUNTER — Ambulatory Visit (INDEPENDENT_AMBULATORY_CARE_PROVIDER_SITE_OTHER): Payer: BC Managed Care – PPO | Admitting: Medical-Surgical

## 2022-06-09 VITALS — BP 118/77 | HR 87 | Resp 20 | Ht 64.0 in | Wt 195.2 lb

## 2022-06-09 DIAGNOSIS — R5383 Other fatigue: Secondary | ICD-10-CM

## 2022-06-09 DIAGNOSIS — R7309 Other abnormal glucose: Secondary | ICD-10-CM | POA: Diagnosis not present

## 2022-06-09 DIAGNOSIS — Z23 Encounter for immunization: Secondary | ICD-10-CM | POA: Diagnosis not present

## 2022-06-09 DIAGNOSIS — U099 Post covid-19 condition, unspecified: Secondary | ICD-10-CM | POA: Diagnosis not present

## 2022-06-09 DIAGNOSIS — F418 Other specified anxiety disorders: Secondary | ICD-10-CM

## 2022-06-09 MED ORDER — TRAZODONE HCL 50 MG PO TABS
25.0000 mg | ORAL_TABLET | Freq: Every evening | ORAL | 1 refills | Status: DC | PRN
Start: 2022-06-09 — End: 2023-01-16

## 2022-06-09 MED ORDER — ESCITALOPRAM OXALATE 5 MG PO TABS: 5.0000 mg | ORAL_TABLET | Freq: Every day | ORAL | 3 refills | Status: DC

## 2022-06-09 NOTE — Progress Notes (Signed)
Medical screening examination/treatment was performed by qualified nurse practitioner student and as supervising provider I was immediately available for consultation/collaboration. I have reviewed documentation and agree with assessment and plan. ° °Keta Vanvalkenburgh L. Evva Din, DNP, APRN, FNP-BC °[facility] °Primary Care and Sports Medicine ° °

## 2022-06-09 NOTE — Patient Instructions (Signed)
How to manage your energy levels - [facility]OT

## 2022-06-09 NOTE — Progress Notes (Signed)
Established Patient Office Visit  Subjective   Patient ID: Betty Stanton, female    DOB: 02-07-1986  Age: 36 y.o. MRN: [id number]  Chief Complaint  Patient presents with   Follow-up    HPI  Betty Stanton is a very pleasant 36 year old female presents to the clinic today with complaints of fatigue and concerns that she is experiencing long Covid symptoms from an the infection back in January 2021. She reports that her fatigue has worsen since then. She reports increased fatigue and shortness of breath with activity. She reports difficulty getting up in the morning and not able to do much activity. She reports that this symptoms are worst right before her period that comes regularly.   She reports feeling anxious and could not stop thinking about anything and everything. She endorsed passive suicidal ideations in the past with no plan. She complaints of intermittent depression as well, sometimes debilitating and does not want to get up, worst around her premenstrual cycle.   She reports consuming a generic diet, no activity due to symptoms mention above. She reports difficulty falling asleep but no issues staying asleep. She feels less than fair when it comes to the current status of her health.   Review of Systems  Constitutional:  Positive for malaise/fatigue.  HENT: Negative.    Eyes: Negative.   Respiratory:  Positive for shortness of breath.        With minimum activity  Cardiovascular: Negative.   Gastrointestinal: Negative.   Genitourinary: Negative.   Musculoskeletal: Negative.   Skin: Negative.   Neurological: Negative.   Psychiatric/Behavioral: Negative.        Objective:     BP 118/77 (BP Location: Right Arm, Cuff Size: Normal)   Pulse 87   Resp 20   Ht 5\' 4"  (1.626 m)   Wt 88.5 kg   SpO2 98%   BMI 33.51 kg/m    Physical Exam Constitutional:      General: She is not in acute distress.    Appearance: Normal appearance. She is not ill-appearing, toxic-appearing or  diaphoretic.  HENT:     Head: Normocephalic and atraumatic.  Cardiovascular:     Rate and Rhythm: Normal rate and regular rhythm.     Pulses: Normal pulses.     Heart sounds: Normal heart sounds.  Pulmonary:     Effort: Pulmonary effort is normal.  Skin:    General: Skin is warm and dry.  Neurological:     General: No focal deficit present.     Mental Status: She is alert and oriented to person, place, and time. Mental status is at baseline.  Psychiatric:        Mood and Affect: Mood normal.        Behavior: Behavior normal.        Thought Content: Thought content normal.        Judgment: Judgment normal.      10/02/2020    1:16 PM 06/26/2019    2:08 PM 04/06/2017    7:42 AM  Depression screen PHQ 2/9  Decreased Interest 0 0 1  Down, Depressed, Hopeless 1 2 1   PHQ - 2 Score 1 2 2   Altered sleeping  3   Tired, decreased energy  1   Change in appetite  2   Feeling bad or failure about yourself   0   Trouble concentrating  1   Moving slowly or fidgety/restless  2   Suicidal thoughts  0   PHQ-9 Score  11   Difficult doing work/chores  Somewhat difficult       06/26/2019    2:08 PM  GAD 7 : Generalized Anxiety Score  Nervous, Anxious, on Edge 2  Control/stop worrying 2  Worry too much - different things 2  Trouble relaxing 0  Restless 2  Easily annoyed or irritable 0  Afraid - awful might happen 3  Total GAD 7 Score 11  Anxiety Difficulty Somewhat difficult   No results found for any visits on 06/09/22.    The ASCVD Risk score (Arnett DK, et al., 2019) failed to calculate for the following reasons:   The 2019 ASCVD risk score is only valid for ages 39 to 24    Assessment & Plan:   1. Depression with anxiety PHQ9/GAD7 scores elevated today. Discussed the relationship between mental health and constitutional symptoms. Feel that her current mental health is contributing to her fatigue. Starting Lexapro 5mg  daily. Reviewed medication effectiveness expectations and  associated timeline. Reviewed potential side effects. Issues with sleep likely related to depression and anxiety as well as sedentary lifestyle. Start Trazodone 25-50mg  nightly for sleep. Advised that she will see the best results when taking this regularly rather than as needed. We instructed her to start the Lexapro a few days after the trazodone to make sure she can tolerate the new medications.   -Lexapro 5 mg daily. -Trazodone 50 mg, 0.5-1 tab at nighttime as needed for sleep.   2. Fatigue, unspecified type Modified Borg Scale for evaluation of fatigue/dyspnea used during a six minute walk test. Oxygen saturations and blood pressure stable. Pulse mildly elevated during activity but not to a concerning level. Patient report of fatigue/dyspnea started at 1 with a gradual increase to 5-6 indicating moderate symptoms. Consider relation to anxiety/depression and poor sleep quality. Sedentary lifestyle also not helpful. Encouraged starting light physical activity very slowly with increases as tolerated in time and intensity. Medications as above.   - CBC with Differential/Platelet - COMPLETE METABOLIC PANEL WITH GFR - TSH - Hemoglobin A1c  3. Long COVID Symptoms this long after her COVID infection in early 2021 are not impossible but not the likely culprit to her complaints. Reviewed addressing factors that are known issues such as mental health concerns while ruling out other potential causes with blood work and any necessary testing.   4. Need for influenza vaccination Flu shot given.  - Flu Vaccine QUAD 6+ mos PF IM (Fluarix Quad PF)  Return in about 6 weeks (around 07/21/2022) for mood/fatigue follow up.    07/23/2022, RN Student NP

## 2022-06-10 LAB — CBC WITH DIFFERENTIAL/PLATELET
Absolute Monocytes: 800 cells/uL (ref 200–950)
Basophils Absolute: 61 cells/uL (ref 0–200)
Basophils Relative: 0.7 %
Eosinophils Absolute: 139 cells/uL (ref 15–500)
Eosinophils Relative: 1.6 %
HCT: 41.5 % (ref 35.0–45.0)
Hemoglobin: 13.6 g/dL (ref 11.7–15.5)
Lymphs Abs: 2662 cells/uL (ref 850–3900)
MCH: 28.8 pg (ref 27.0–33.0)
MCHC: 32.8 g/dL (ref 32.0–36.0)
MCV: 87.9 fL (ref 80.0–100.0)
MPV: 11 fL (ref 7.5–12.5)
Monocytes Relative: 9.2 %
Neutro Abs: 5037 cells/uL (ref 1500–7800)
Neutrophils Relative %: 57.9 %
Platelets: 264 10*3/uL (ref 140–400)
RBC: 4.72 10*6/uL (ref 3.80–5.10)
RDW: 12.6 % (ref 11.0–15.0)
Total Lymphocyte: 30.6 %
WBC: 8.7 10*3/uL (ref 3.8–10.8)

## 2022-06-10 LAB — COMPLETE METABOLIC PANEL WITH GFR
AG Ratio: 2 (calc) (ref 1.0–2.5)
ALT: 15 U/L (ref 6–29)
AST: 11 U/L (ref 10–30)
Albumin: 4.3 g/dL (ref 3.6–5.1)
Alkaline phosphatase (APISO): 53 U/L (ref 31–125)
BUN: 14 mg/dL (ref 7–25)
CO2: 27 mmol/L (ref 20–32)
Calcium: 9.9 mg/dL (ref 8.6–10.2)
Chloride: 105 mmol/L (ref 98–110)
Creat: 0.73 mg/dL (ref 0.50–0.97)
Globulin: 2.2 g/dL (calc) (ref 1.9–3.7)
Glucose, Bld: 87 mg/dL (ref 65–99)
Potassium: 4.5 mmol/L (ref 3.5–5.3)
Sodium: 139 mmol/L (ref 135–146)
Total Bilirubin: 0.7 mg/dL (ref 0.2–1.2)
Total Protein: 6.5 g/dL (ref 6.1–8.1)
eGFR: 110 mL/min/{1.73_m2} (ref >=60)

## 2022-06-10 LAB — HEMOGLOBIN A1C
Hgb A1c MFr Bld: 5.1 % of total Hgb (ref <5.7)
Mean Plasma Glucose: 100 mg/dL
eAG (mmol/L): 5.5 mmol/L

## 2022-06-10 LAB — TSH: TSH: 2.19 mIU/L

## 2022-06-30 ENCOUNTER — Ambulatory Visit
Admission: RE | Admit: 2022-06-30 | Discharge: 2022-06-30 | Disposition: A | Discharge status: Home / Self Care | Payer: BC Managed Care – PPO | Source: Ambulatory Visit | Attending: Medical-Surgical | Admitting: Medical-Surgical

## 2022-06-30 DIAGNOSIS — Z1231 Encounter for screening mammogram for malignant neoplasm of breast: Secondary | ICD-10-CM | POA: Diagnosis not present

## 2022-07-05 ENCOUNTER — Other Ambulatory Visit: Payer: Self-pay | Admitting: Medical-Surgical

## 2022-07-05 DIAGNOSIS — R928 Other abnormal and inconclusive findings on diagnostic imaging of breast: Secondary | ICD-10-CM

## 2022-07-14 ENCOUNTER — Other Ambulatory Visit: Payer: Self-pay | Admitting: Medical-Surgical

## 2022-07-14 ENCOUNTER — Ambulatory Visit
Admission: RE | Admit: 2022-07-14 | Discharge: 2022-07-14 | Disposition: A | Discharge status: Home / Self Care | Payer: BC Managed Care – PPO | Source: Ambulatory Visit | Attending: Medical-Surgical | Admitting: Medical-Surgical

## 2022-07-14 DIAGNOSIS — R928 Other abnormal and inconclusive findings on diagnostic imaging of breast: Secondary | ICD-10-CM

## 2022-07-14 DIAGNOSIS — R92331 Mammographic heterogeneous density, right breast: Secondary | ICD-10-CM | POA: Diagnosis not present

## 2022-07-14 DIAGNOSIS — N6313 Unspecified lump in the right breast, lower outer quadrant: Secondary | ICD-10-CM | POA: Diagnosis not present

## 2022-07-14 DIAGNOSIS — N6310 Unspecified lump in the right breast, unspecified quadrant: Secondary | ICD-10-CM

## 2022-07-14 DIAGNOSIS — N6314 Unspecified lump in the right breast, lower inner quadrant: Secondary | ICD-10-CM | POA: Diagnosis not present

## 2022-07-21 ENCOUNTER — Ambulatory Visit: Payer: BC Managed Care – PPO | Admitting: Medical-Surgical

## 2022-08-04 ENCOUNTER — Ambulatory Visit
Admission: RE | Admit: 2022-08-04 | Discharge: 2022-08-04 | Disposition: A | Discharge status: Home / Self Care | Payer: BC Managed Care – PPO | Source: Ambulatory Visit | Attending: Medical-Surgical | Admitting: Medical-Surgical

## 2022-08-04 DIAGNOSIS — D241 Benign neoplasm of right breast: Secondary | ICD-10-CM | POA: Diagnosis not present

## 2022-08-04 DIAGNOSIS — N6310 Unspecified lump in the right breast, unspecified quadrant: Secondary | ICD-10-CM

## 2022-08-04 DIAGNOSIS — N6342 Unspecified lump in left breast, subareolar: Secondary | ICD-10-CM | POA: Diagnosis not present

## 2022-08-04 HISTORY — PX: BREAST BIOPSY: SHX20

## 2022-11-29 ENCOUNTER — Encounter: Payer: Self-pay | Admitting: Medical-Surgical

## 2022-11-29 DIAGNOSIS — G43909 Migraine, unspecified, not intractable, without status migrainosus: Secondary | ICD-10-CM

## 2022-12-06 IMAGING — DX DG FOOT COMPLETE 3+V*R*
3 series · 3 of 3 positions shown · non-contrast
Comparison: None.

CLINICAL DATA: Bilateral foot pain

EXAM:
RIGHT FOOT COMPLETE - 3+ VIEW; LEFT FOOT - COMPLETE 3+ VIEW

[foot ap]
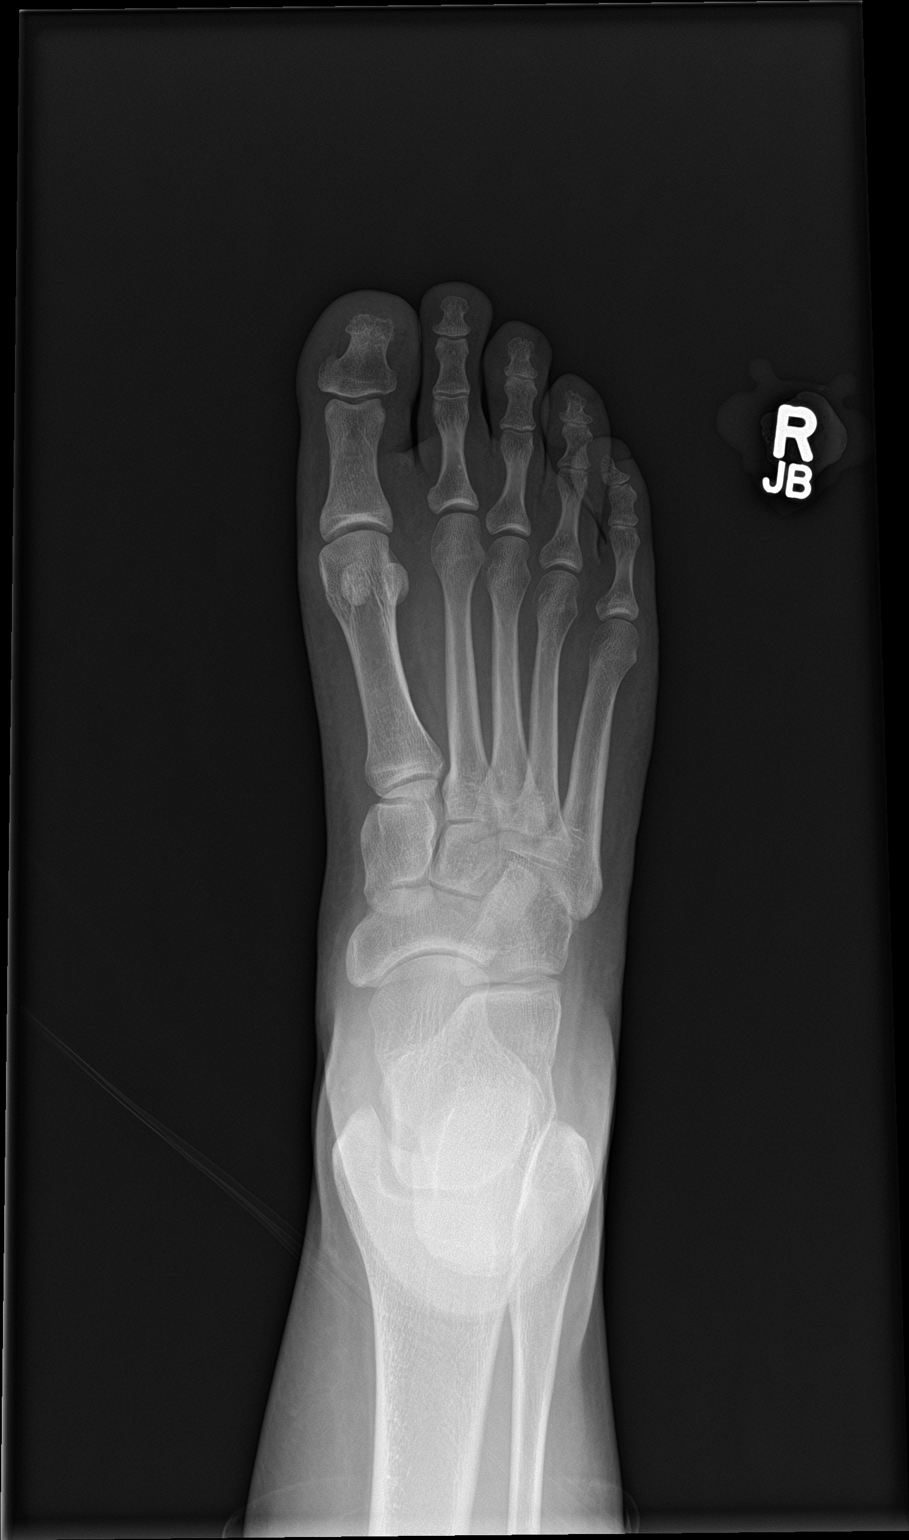

[foot obl]
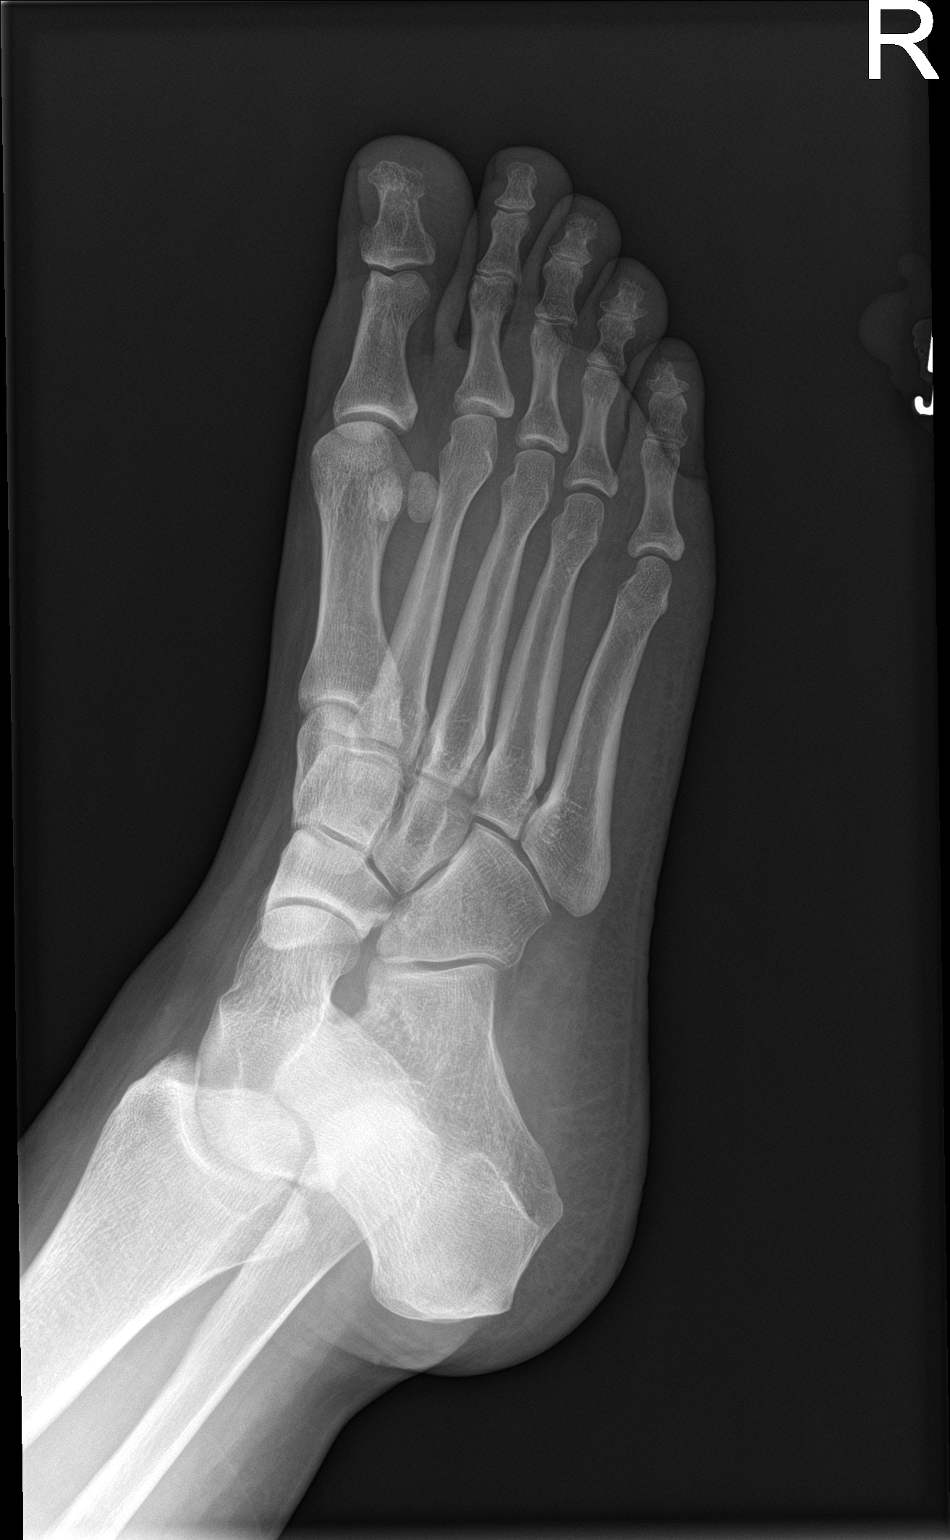

[foot lat]
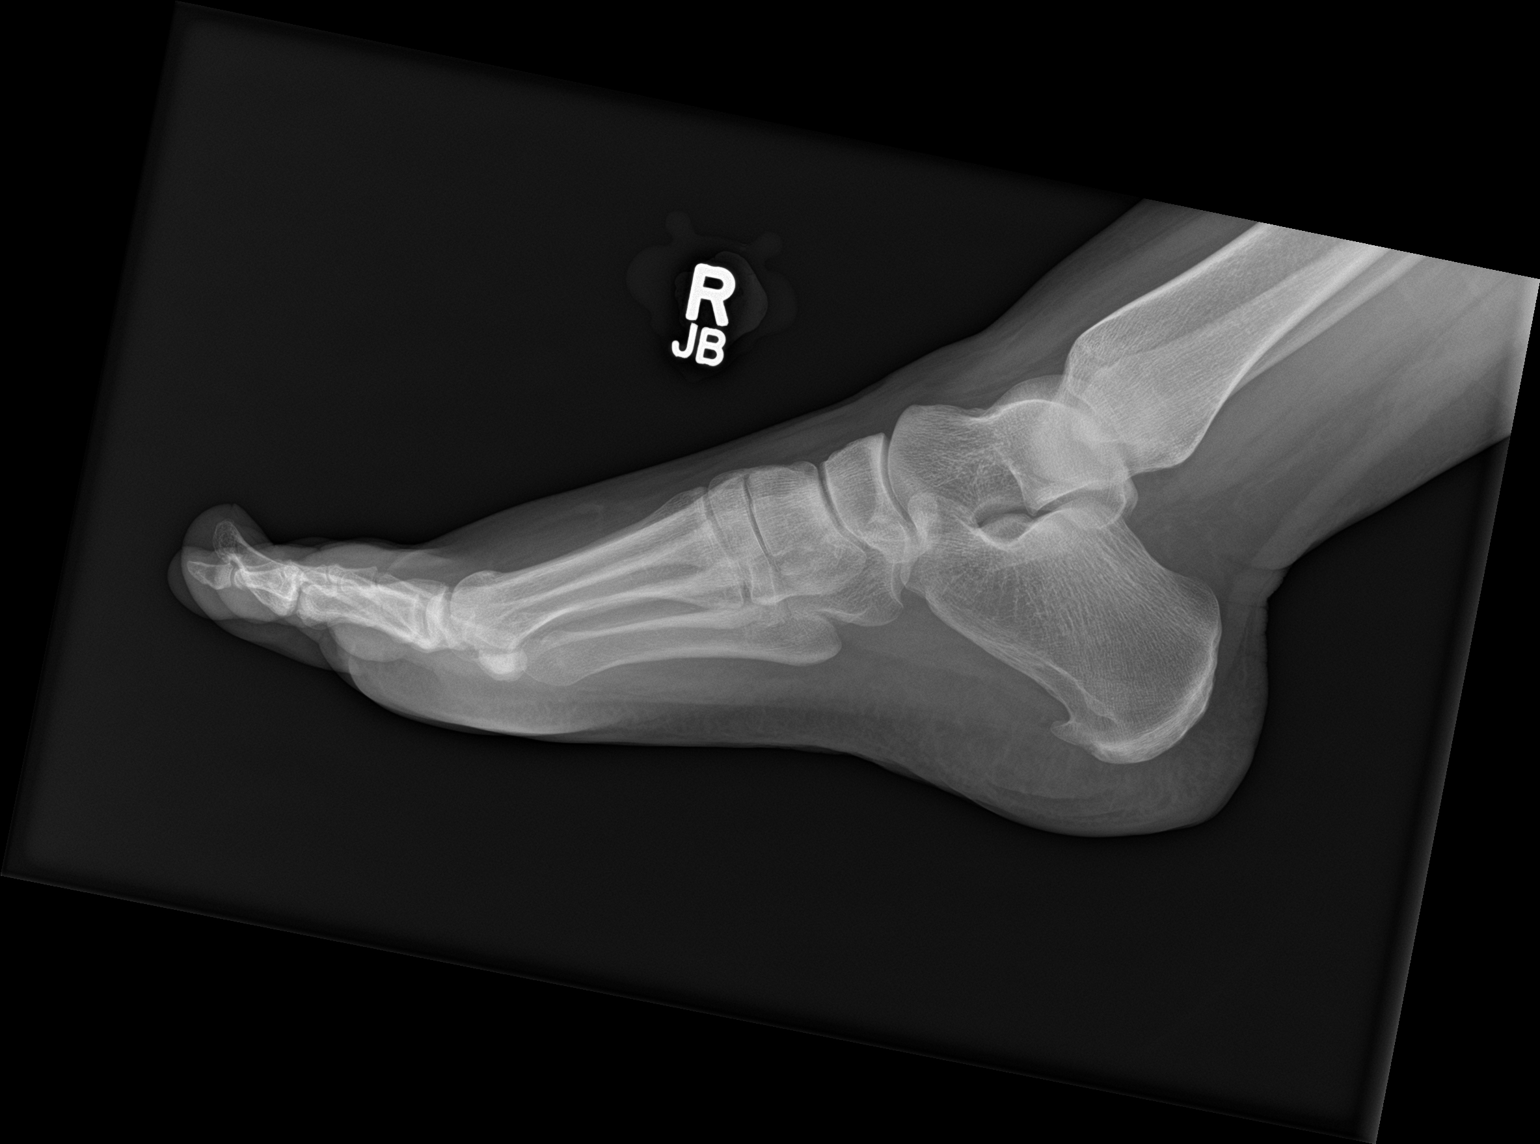

[3 of 3 positions shown; findings below may reference images not displayed]

FINDINGS: There is no evidence of fracture or dislocation of the bilateral
feet. Small plantar calcaneal spurs bilaterally. Joint spaces are
maintained. There is no evidence of arthropathy or other focal bone
abnormality. Soft tissues are unremarkable.
IMPRESSION: 1. No acute osseous abnormality or significant arthropathy of the
bilateral feet.
2. Small bilateral plantar calcaneal spurs.

## 2022-12-13 ENCOUNTER — Ambulatory Visit: Payer: BC Managed Care – PPO | Admitting: Medical-Surgical

## 2022-12-19 ENCOUNTER — Ambulatory Visit (INDEPENDENT_AMBULATORY_CARE_PROVIDER_SITE_OTHER): Payer: Self-pay | Admitting: Medical-Surgical

## 2022-12-19 ENCOUNTER — Encounter: Payer: Self-pay | Admitting: Medical-Surgical

## 2022-12-19 VITALS — BP 127/86 | HR 89 | Temp 99.5°F | Ht 64.0 in | Wt 206.0 lb

## 2022-12-19 DIAGNOSIS — F418 Other specified anxiety disorders: Secondary | ICD-10-CM

## 2022-12-19 DIAGNOSIS — G43909 Migraine, unspecified, not intractable, without status migrainosus: Secondary | ICD-10-CM | POA: Insufficient documentation

## 2022-12-19 MED ORDER — SERTRALINE HCL 25 MG PO TABS: 25.0000 mg | ORAL_TABLET | Freq: Every day | ORAL | 3 refills | Status: DC

## 2022-12-19 MED ORDER — TOPIRAMATE 25 MG PO TABS: 25.0000 mg | ORAL_TABLET | Freq: Two times a day (BID) | ORAL | 1 refills | Status: DC

## 2022-12-19 MED ORDER — RIZATRIPTAN BENZOATE 10 MG PO TBDP
10.0000 mg | ORAL_TABLET | ORAL | 3 refills | Status: AC | PRN
Start: 2022-12-19 — End: ?

## 2022-12-19 NOTE — Assessment & Plan Note (Signed)
Pleasant 37 year old female presenting today with a history of migraines.  Notes that they were previously infrequent only occurring approximately once every 1 to 2 years.  Unfortunately, her migraine frequency has greatly increased lately.  Over the last month, she has had greater than 15 headache days per month.  Has been treating with over-the-counter ibuprofen which has only helped moderately.  Headaches accompanied by right eye pain, severe photophobia/phonophobia, nausea, and vomiting.  Reports that she feels better after vomiting.  Not currently on any preventative or abortive therapies.  Interested in options.  Discussed optimal management.  Starting Topamax 25 mg twice daily.  Adding Maxalt 10 mg daily as needed, repeat x 1 2 hours after initial dose for abortive therapy.  Referral in place to neurology.  Plan to schedule with them for further evaluation but if regimen works, okay to cancel.

## 2022-12-19 NOTE — Progress Notes (Signed)
        Established patient visit  History, exam, impression, and plan:  Migraine without status migrainosus, not intractable Betty Stanton 37 year old female presenting today with a history of migraines.  Notes that they were previously infrequent only occurring approximately once every 1 to 2 years.  Unfortunately, her migraine frequency has greatly increased lately.  Over the last month, she has had greater than 15 headache days per month.  Has been treating with over-the-counter ibuprofen which has only helped moderately.  Headaches accompanied by right eye pain, severe photophobia/phonophobia, nausea, and vomiting.  Reports that she feels better after vomiting.  Not currently on any preventative or abortive therapies.  Interested in options.  Discussed optimal management.  Starting Topamax 25 mg twice daily.  Adding Maxalt 10 mg daily as needed, repeat x 1 2 hours after initial dose for abortive therapy.  Referral in place to neurology.  Plan to schedule with them for further evaluation but if regimen works, okay to cancel.  Depression with anxiety Previous discussion held regarding depression and anxiety symptoms.  Feels that anxiety is more of a struggle lately.  Has a lot going on at home and is starting a new job as well as looking to relocate to [place].  Tried Lexapro 5 mg daily however this seemed to cause some significant insomnia.  Reports that she tried to take it several different times of the day but it did not help.  Has stopped Lexapro on her own, off approximately 3-4 weeks.  Interested in something different.  After review of options, starting sertraline 25 mg daily.  Will reevaluate in 4 to 6 weeks for effectiveness and need for change in dose.  Procedures performed this visit: None.  Return in 4 weeks (on 01/16/2023) for mood/migraine follow up.  __________________________________ Thayer Ohm, DNP, APRN, FNP-BC Primary Care and Sports Medicine [facility]  [place]

## 2022-12-19 NOTE — Assessment & Plan Note (Signed)
Previous discussion held regarding depression and anxiety symptoms.  Feels that anxiety is more of a struggle lately.  Has a lot going on at home and is starting a new job as well as looking to relocate to [place].  Tried Lexapro 5 mg daily however this seemed to cause some significant insomnia.  Reports that she tried to take it several different times of the day but it did not help.  Has stopped Lexapro on her own, off approximately 3-4 weeks.  Interested in something different.  After review of options, starting sertraline 25 mg daily.  Will reevaluate in 4 to 6 weeks for effectiveness and need for change in dose.

## 2023-01-16 ENCOUNTER — Encounter: Payer: Self-pay | Admitting: Medical-Surgical

## 2023-01-16 ENCOUNTER — Ambulatory Visit: Payer: Self-pay | Admitting: Medical-Surgical

## 2023-01-16 ENCOUNTER — Telehealth (INDEPENDENT_AMBULATORY_CARE_PROVIDER_SITE_OTHER): Payer: Self-pay | Admitting: Medical-Surgical

## 2023-01-16 DIAGNOSIS — G43909 Migraine, unspecified, not intractable, without status migrainosus: Secondary | ICD-10-CM

## 2023-01-16 DIAGNOSIS — F418 Other specified anxiety disorders: Secondary | ICD-10-CM

## 2023-01-16 MED ORDER — TOPIRAMATE 25 MG PO TABS: 25.0000 mg | ORAL_TABLET | Freq: Two times a day (BID) | ORAL | 1 refills | Status: DC

## 2023-01-16 MED ORDER — SERTRALINE HCL 50 MG PO TABS: 50.0000 mg | ORAL_TABLET | Freq: Every day | ORAL | 1 refills | Status: DC

## 2023-01-16 MED ORDER — ONDANSETRON 4 MG PO TBDP: 4.0000 mg | ORAL_TABLET | Freq: Three times a day (TID) | ORAL | 5 refills | Status: AC | PRN

## 2023-01-16 NOTE — Progress Notes (Signed)
Virtual Visit via Video Note  I connected with Betty Stanton on 01/16/23 at  2:00 PM EDT by a video enabled telemedicine application and verified that I am speaking with the correct person using two identifiers.   I discussed the limitations of evaluation and management by telemedicine and the availability of in person appointments. The patient expressed understanding and agreed to proceed.  Patient location: home Provider locations: office  Subjective:    CC: mood/migraine follow up  HPI: Pleasant 37 year old female presenting via MyChart video visit for follow up on:  Migraines: was started on Topamax 25mg  BID for prevention. Since starting this, she has only had 1 breakthrough migraine which responded very well to prn Maxalt. Has had some mild tingling in her fingers but no other significant side effects, During her one migraine, she had difficulty with nausea and would like to have something on hand to use prn for this.   Mood: Started sertraline 25mg  daily about 4 weeks ago. Tolerating the medication well although she did have some ups and downs during the first week or so with it. Feels like the medication is working somewhat but not quite as much as it could. Interested in increasing the dose. Denies SI/HI.    Past medical history, Surgical history, Family history not pertinant except as noted below, Social history, Allergies, and medications have been entered into the medical record, reviewed, and corrections made.   Review of Systems: See HPI for pertinent positives and negatives.   Objective:    General: Speaking clearly in complete sentences without any shortness of breath.  Alert and oriented x3.  Normal judgment. No apparent acute distress.  Impression and Recommendations:    1. Migraine without status migrainosus, not intractable, unspecified migraine type Excellent response to Topamax. She feels the tingling is minimal and would like to continue the medication.  Continue 25mg  BID as prescribed. Continue prn Maxalt. Adding Zofran ODT 4mg  every 8 hours prn nausea.   2. Depression with anxiety Some improvement noted. Increasing Sertraline to 50mg  daily.   I discussed the assessment and treatment plan with the patient. The patient was provided an opportunity to ask questions and all were answered. The patient agreed with the plan and demonstrated an understanding of the instructions.   The patient was advised to call back or seek an in-person evaluation if the symptoms worsen or if the condition fails to improve as anticipated.  25 minutes of non-face-to-face time was provided during this encounter.  Return in about 6 weeks (around 02/27/2023) for mood follow up .  Thayer Ohm, DNP, APRN, FNP-BC [facility] and Sports Medicine

## 2023-04-05 ENCOUNTER — Other Ambulatory Visit: Payer: Self-pay | Admitting: Medical-Surgical

## 2023-05-24 ENCOUNTER — Other Ambulatory Visit: Payer: Self-pay | Admitting: Medical-Surgical

## 2023-05-26 MED ORDER — TOPIRAMATE 25 MG PO TABS: 25.0000 mg | ORAL_TABLET | Freq: Two times a day (BID) | ORAL | 0 refills | Status: DC

## 2023-06-29 ENCOUNTER — Other Ambulatory Visit: Payer: Self-pay | Admitting: Medical-Surgical

## 2023-06-30 ENCOUNTER — Telehealth (INDEPENDENT_AMBULATORY_CARE_PROVIDER_SITE_OTHER): Payer: Self-pay | Admitting: Medical-Surgical

## 2023-06-30 ENCOUNTER — Encounter: Payer: Self-pay | Admitting: Medical-Surgical

## 2023-06-30 DIAGNOSIS — R4589 Other symptoms and signs involving emotional state: Secondary | ICD-10-CM

## 2023-06-30 DIAGNOSIS — G43909 Migraine, unspecified, not intractable, without status migrainosus: Secondary | ICD-10-CM

## 2023-06-30 MED ORDER — SERTRALINE HCL 50 MG PO TABS: 50.0000 mg | ORAL_TABLET | Freq: Every day | ORAL | 1 refills | Status: DC

## 2023-06-30 MED ORDER — TOPIRAMATE 25 MG PO TABS: 25.0000 mg | ORAL_TABLET | Freq: Two times a day (BID) | ORAL | 1 refills | Status: DC

## 2023-06-30 NOTE — Progress Notes (Signed)
Virtual Visit via Video Note  I connected with Betty Stanton on 06/30/23 at  2:00 PM EDT by a video enabled telemedicine application and verified that I am speaking with the correct person using two identifiers.   I discussed the limitations of evaluation and management by telemedicine and the availability of in person appointments. The patient expressed understanding and agreed to proceed.  Patient location: home Provider locations: office  Subjective:    CC: mood/migraine follow up  HPI: Pleasant 37 year old female presenting via MyChart video visit for the following:  Mood: Taking sertraline 50 mg daily, tolerating well without side effects.  Feels that the medication has been working well for her however recently had a depressive episode that she felt was severe.  Admitted to having thoughts of self-harm but did not have an active plan.  Did not commit any self-harm.  Denies current thoughts of self-harm/HI.  Is not sure that she wants to go up on the medicine as she thinks she may have missed a dose or 2 and that prompted the depressive episode.  Migraines: Taking Topamax 25 mg twice daily, tolerating well without side effects.  Notes that the medication has worked very well and she has only had 2 breakthrough headaches that were low-level and lasted for a couple of days.  She usually takes ibuprofen for breakthrough headaches which provided temporary help.  She took Maxalt which resolved the headaches completely.  At 1 point, she decided to try to take Topamax once a day however this was not effective and she began to have increased headaches.  She has resumed twice daily dosing and feels that it is working well.   Past medical history, Surgical history, Family history not pertinant except as noted below, Social history, Allergies, and medications have been entered into the medical record, reviewed, and corrections made.   Review of Systems: See HPI for pertinent positives and  negatives.   Objective:    General: Speaking clearly in complete sentences without any shortness of breath.  Alert and oriented x3.  Normal judgment. No apparent acute distress.  Impression and Recommendations:    1. Anxiety about health Plan to continue sertraline 50 mg daily.  Discussed possibly increasing her dose to 75 mg or 100 mg daily but she would like to hold on for now since she thinks she may have missed a couple doses.  She will be diligent in daily dosing but if she has another episode like before, she will send me a MyChart message letting me know to go ahead and increase her dose.  2. Migraine without status migrainosus, not intractable, unspecified migraine type Stable on current regimen.  Continue Topamax 25 mg twice daily.  Okay to use ibuprofen and/or Maxalt for breakthrough migraines.  Zofran on hand for severe migraines.   I discussed the assessment and treatment plan with the patient. The patient was provided an opportunity to ask questions and all were answered. The patient agreed with the plan and demonstrated an understanding of the instructions.   The patient was advised to call back or seek an in-person evaluation if the symptoms worsen or if the condition fails to improve as anticipated.  Return in about 6 months (around 12/29/2023) for migraine/mood follow up.  Thayer Ohm, DNP, APRN, FNP-BC [facility] and Sports Medicine

## 2024-01-02 ENCOUNTER — Other Ambulatory Visit: Payer: Self-pay | Admitting: Medical-Surgical

## 2024-02-13 ENCOUNTER — Encounter: Payer: Self-pay | Admitting: Medical-Surgical

## 2024-05-31 ENCOUNTER — Other Ambulatory Visit: Payer: Self-pay | Admitting: Medical-Surgical

## 2024-06-03 ENCOUNTER — Other Ambulatory Visit: Payer: Self-pay | Admitting: Medical-Surgical

## 2024-06-03 NOTE — Telephone Encounter (Signed)
 Provider, verify Rizatriptan  directions, prior RX was able to repeat once in two hours, for mdd 2 tablets. Keep as is, or able to repeat? Thanks

## 2024-06-09 ENCOUNTER — Other Ambulatory Visit: Payer: Self-pay | Admitting: Medical-Surgical

## 2024-07-23 ENCOUNTER — Other Ambulatory Visit: Payer: Self-pay | Admitting: Medical-Surgical

## 2024-10-09 ENCOUNTER — Other Ambulatory Visit: Payer: Self-pay | Admitting: Medical-Surgical
